# Patient Record
Sex: Female | Born: 1950
Health system: Southern US, Community
[De-identification: ages and names within clinical notes are randomized; demographics above are authoritative.]

## PROBLEM LIST (undated history)

## (undated) DIAGNOSIS — S82142A Displaced bicondylar fracture of left tibia, initial encounter for closed fracture: Secondary | ICD-10-CM

## (undated) DIAGNOSIS — G8929 Other chronic pain: Secondary | ICD-10-CM

## (undated) DIAGNOSIS — E079 Disorder of thyroid, unspecified: Secondary | ICD-10-CM

---

## 1998-06-03 HISTORY — PX: REDUCTION MAMMAPLASTY: SUR839

## 2015-04-05 NOTE — Other (Signed)
Pat assessment completed with patient-reveals past history of MRSA with treatment 2014.l spoke with Dr Brien FewMantica and pre op antibiotic changed to vancomycin per order.

## 2015-04-05 NOTE — H&P (Signed)
[facility]  HISTORY AND PHYSICAL    Name:  Janet Chavez, Janet Chavez  MR#:  [id number]  DOB:  07-08-1950  Account #:  [id number]  Date of Adm:  04/06/2015    DATE OF SURGERY: 04/06/2015    HISTORY OF PRESENT ILLNESS: This 64 year old lady had a fall  several days ago getting out of bed. She had a valgus injury of  her left knee and a tibial plateau fracture. Lateral tibial  plateau fracture was diagnosed on plain x-rays, but the CT scan  did show depression and a small posterior corner fracture. The  patient understands the picture of the fracture. I showed her the  CT scan pictures and the x-rays and have described kyphoplasty as  a minimally invasive treatment for this tibial plateau fracture.  She understands that the risks include, but are not limited to,  infection, DVT with pulmonary embolus and pain postop. She  understands that she has to continue on limited weightbearing  schedule in a walker which she has. The patient has a past  medical history for COPD for which she takes ProAir twice a day  and uses a nebulizer on a p.r.n. basis. She has not required it  for about a month. She takes Bentyl and Nexium for acid reflux.  She takes Xanax and Vistaril for panic attacks. She takes Imitrex  for migraines and Ambien for sleep and she has taken oral  morphine over a period of 2 years, anywhere from 1-3 tablets  daily. For the past 3 days she has taken Excedrin. It is  recommended to the patient that she go back on the morphine prior  to surgery and be weaned off it after surgery on a gradual basis.    REVIEW OF SYSTEMS  GENERAL: She denies fevers, chills, fatigue.  HEAD, EARS, EYES, NOSE, THROAT: Denies cold symptoms, sinus pain.  RESPIRATORY: Denies cough or shortness of breath.  CARDIOVASCULAR: Denies chest pain or palpitations.  GASTROINTESTINAL: The patient had symptoms of abdominal pain, but  she has had no abdominal pain, nausea or vomiting with the Bentyl  and Nexium.   HEMATOLOGY: The patient has had no bleeding problems or anemia.  MUSCULOSKELETAL: She complains of left knee pain.  PERIPHERAL VASCULAR: No history of blood clots.  DERMATOLOGY: No history of rashes or skin lesions.  NEUROLOGIC: The patient has had no prior gait abnormality before  this injury. She has no seizure disorder.    The patient has had prior surgery with hysterectomy,  oophorectomy, gastric bypass, rotator cuff surgery, appendectomy,  left knee arthroscopy, left ankle surgery and right wrist  fracture that was treated nonoperatively.    ALLERGIES: THE PATIENT HAS NO KNOWN ALLERGIES.    PHYSICAL EXAMINATION  GENERAL: She is a well-developed, well-nourished, alert and  oriented though nervous, 64 year old lady.  VITAL SIGNS: Blood pressure is 126/80, heart rate is 84 and  regular, respiratory rate 16. The patient is 5 feet 6 inches  tall, weighs 250 pounds for a BMI of 40.35.  HEAD, EARS, EYES, NOSE AND THROAT: Atraumatic.  NECK: Supple.  CHEST: Clear to auscultation bilaterally. There is no wheezing.  She has no rhonchi.  HEART: Sounds are regular rhythm.  ABDOMEN: Soft and nontender.  EXTREMITIES: She has good pulses in all four extremities. In the  left knee, there is some mild swelling. There is tenderness  laterally and medially and there is a varus valgus instability  with pain.    DIAGNOSIS: Closed lateral tibial plateau fracture.  PLAN: Kyphoplasty.          Olen Pel MD    AV:[id number]  D:  04/05/2015   07:48  T:  04/05/2015   08:21  Job #:  [id number]

## 2015-04-06 ENCOUNTER — Inpatient Hospital Stay: Payer: MEDICARE

## 2015-04-06 ENCOUNTER — Ambulatory Visit: Admit: 2015-04-06 | Payer: MEDICARE | Primary: Emergency Medicine

## 2015-04-06 MED ORDER — BUPIVACAINE (PF) 0.5 % (5 MG/ML) IJ SOLN
0.5 % (5 mg/mL) | INTRAMUSCULAR | Status: AC
Start: 2015-04-06 — End: ?

## 2015-04-06 MED ORDER — LACTATED RINGERS BOLUS IV
Freq: Once | INTRAVENOUS | Status: DC | PRN
Start: 2015-04-06 — End: 2015-04-06

## 2015-04-06 MED ORDER — DEXAMETHASONE SODIUM PHOSPHATE 4 MG/ML IJ SOLN
4 mg/mL | INTRAMUSCULAR | Status: DC | PRN
Start: 2015-04-06 — End: 2015-04-06
  Administered 2015-04-06: 13:00:00 via INTRAVENOUS

## 2015-04-06 MED ORDER — SODIUM CHLORIDE 0.9 % IV PIGGY BACK
1000 mg | Freq: Once | INTRAVENOUS | Status: AC
Start: 2015-04-06 — End: 2015-04-06
  Administered 2015-04-06 (×2): via INTRAVENOUS

## 2015-04-06 MED ORDER — DEXAMETHASONE SODIUM PHOSPHATE 4 MG/ML IJ SOLN
4 mg/mL | INTRAMUSCULAR | Status: AC
Start: 2015-04-06 — End: ?

## 2015-04-06 MED ORDER — ONDANSETRON (PF) 4 MG/2 ML INJECTION
4 mg/2 mL | INTRAMUSCULAR | Status: AC
Start: 2015-04-06 — End: ?

## 2015-04-06 MED ORDER — BUPIVACAINE (PF) 0.5 % (5 MG/ML) IJ SOLN
0.5 % (5 mg/mL) | INTRAMUSCULAR | Status: DC | PRN
Start: 2015-04-06 — End: 2015-04-06
  Administered 2015-04-06: 13:00:00

## 2015-04-06 MED ORDER — IOPAMIDOL 61 % IV SOLN
300 mg iodine /mL (61 %) | INTRAVENOUS | Status: AC
Start: 2015-04-06 — End: ?

## 2015-04-06 MED ORDER — LIDOCAINE-EPINEPHRINE 1 %-1:100,000 IJ SOLN
1 %-:00,000 | INTRAMUSCULAR | Status: AC
Start: 2015-04-06 — End: ?

## 2015-04-06 MED ORDER — BACITRACIN 50,000 UNIT IM
50000 unit | INTRAMUSCULAR | Status: DC | PRN
Start: 2015-04-06 — End: 2015-04-06
  Administered 2015-04-06: 13:00:00

## 2015-04-06 MED ORDER — ONDANSETRON (PF) 4 MG/2 ML INJECTION
4 mg/2 mL | INTRAMUSCULAR | Status: DC | PRN
Start: 2015-04-06 — End: 2015-04-06
  Administered 2015-04-06: 13:00:00 via INTRAVENOUS

## 2015-04-06 MED ORDER — SODIUM CHLORIDE 0.9 % IJ SYRG
Freq: Three times a day (TID) | INTRAMUSCULAR | Status: DC
Start: 2015-04-06 — End: 2015-04-06

## 2015-04-06 MED ORDER — FENTANYL CITRATE (PF) 50 MCG/ML IJ SOLN
50 mcg/mL | INTRAMUSCULAR | Status: AC
Start: 2015-04-06 — End: ?

## 2015-04-06 MED ORDER — ONDANSETRON (PF) 4 MG/2 ML INJECTION
4 mg/2 mL | Freq: Once | INTRAMUSCULAR | Status: DC | PRN
Start: 2015-04-06 — End: 2015-04-06

## 2015-04-06 MED ORDER — FENTANYL CITRATE (PF) 50 MCG/ML IJ SOLN
50 mcg/mL | INTRAMUSCULAR | Status: DC | PRN
Start: 2015-04-06 — End: 2015-04-06
  Administered 2015-04-06: 14:00:00 via INTRAVENOUS

## 2015-04-06 MED ORDER — LIDOCAINE-EPINEPHRINE 1 %-1:100,000 IJ SOLN
1 %-:00,000 | INTRAMUSCULAR | Status: DC | PRN
Start: 2015-04-06 — End: 2015-04-06
  Administered 2015-04-06: 13:00:00 via INTRADERMAL

## 2015-04-06 MED ORDER — PROPOFOL 10 MG/ML IV EMUL
10 mg/mL | INTRAVENOUS | Status: DC | PRN
Start: 2015-04-06 — End: 2015-04-06
  Administered 2015-04-06: 12:00:00 via INTRAVENOUS

## 2015-04-06 MED ORDER — FENTANYL CITRATE (PF) 50 MCG/ML IJ SOLN
50 mcg/mL | INTRAMUSCULAR | Status: AC
Start: 2015-04-06 — End: 2015-04-06
  Administered 2015-04-06: 13:00:00 via INTRAVENOUS

## 2015-04-06 MED ORDER — SODIUM CHLORIDE 0.9 % IJ SYRG
INTRAMUSCULAR | Status: DC | PRN
Start: 2015-04-06 — End: 2015-04-06

## 2015-04-06 MED ORDER — BACITRACIN 50,000 UNIT IM
50000 unit | INTRAMUSCULAR | Status: AC
Start: 2015-04-06 — End: ?

## 2015-04-06 MED ORDER — DIPHENHYDRAMINE HCL 50 MG/ML IJ SOLN
50 mg/mL | Freq: Once | INTRAMUSCULAR | Status: DC | PRN
Start: 2015-04-06 — End: 2015-04-06

## 2015-04-06 MED ORDER — KETOROLAC TROMETHAMINE 30 MG/ML INJECTION
30 mg/mL (1 mL) | INTRAMUSCULAR | Status: AC
Start: 2015-04-06 — End: ?

## 2015-04-06 MED ORDER — LACTATED RINGERS IV
INTRAVENOUS | Status: DC
Start: 2015-04-06 — End: 2015-04-06
  Administered 2015-04-06: 12:00:00 via INTRAVENOUS

## 2015-04-06 MED ORDER — HYDROMORPHONE (PF) 2 MG/ML IJ SOLN
2 mg/mL | INTRAMUSCULAR | Status: AC | PRN
Start: 2015-04-06 — End: 2015-04-06
  Administered 2015-04-06 (×4): via INTRAVENOUS

## 2015-04-06 MED ORDER — FENTANYL CITRATE (PF) 50 MCG/ML IJ SOLN
50 mcg/mL | INTRAMUSCULAR | Status: DC | PRN
Start: 2015-04-06 — End: 2015-04-06
  Administered 2015-04-06: 12:00:00 via INTRAVENOUS

## 2015-04-06 MED ORDER — KETOROLAC TROMETHAMINE 30 MG/ML INJECTION
30 mg/mL (1 mL) | INTRAMUSCULAR | Status: DC | PRN
Start: 2015-04-06 — End: 2015-04-06
  Administered 2015-04-06: 13:00:00 via INTRAVENOUS

## 2015-04-06 MED ORDER — EPHEDRINE SULFATE 50 MG/ML IJ SOLN
50 mg/mL | INTRAMUSCULAR | Status: AC
Start: 2015-04-06 — End: ?

## 2015-04-06 MED ORDER — EPHEDRINE SULFATE 50 MG/ML IJ SOLN
50 mg/mL | INTRAMUSCULAR | Status: DC | PRN
Start: 2015-04-06 — End: 2015-04-06
  Administered 2015-04-06 (×2): via INTRAVENOUS

## 2015-04-06 MED FILL — DEXAMETHASONE SODIUM PHOSPHATE 4 MG/ML IJ SOLN: 4 mg/mL | INTRAMUSCULAR | Qty: 1

## 2015-04-06 MED FILL — VANCOMYCIN 1,000 MG IV SOLR: 1000 mg | INTRAVENOUS | Qty: 1000

## 2015-04-06 MED FILL — XYLOCAINE WITH EPINEPHRINE 1 %-1:100,000 INJECTION SOLUTION: 1 %-:00,000 | INTRAMUSCULAR | Qty: 20

## 2015-04-06 MED FILL — DEXAMETHASONE SODIUM PHOSPHATE 4 MG/ML IJ SOLN: 4 mg/mL | INTRAMUSCULAR | Qty: 4

## 2015-04-06 MED FILL — BACITRACIN 50,000 UNIT IM: 50000 unit | INTRAMUSCULAR | Qty: 50000

## 2015-04-06 MED FILL — KETOROLAC TROMETHAMINE 30 MG/ML INJECTION: 30 mg/mL (1 mL) | INTRAMUSCULAR | Qty: 1

## 2015-04-06 MED FILL — EPHEDRINE SULFATE 50 MG/ML IJ SOLN: 50 mg/mL | INTRAMUSCULAR | Qty: 1

## 2015-04-06 MED FILL — DIPRIVAN 10 MG/ML INTRAVENOUS EMULSION: 10 mg/mL | INTRAVENOUS | Qty: 200

## 2015-04-06 MED FILL — LACTATED RINGERS IV: INTRAVENOUS | Qty: 1000

## 2015-04-06 MED FILL — BD POSIFLUSH NORMAL SALINE 0.9 % INJECTION SYRINGE: INTRAMUSCULAR | Qty: 10

## 2015-04-06 MED FILL — LACTATED RINGERS IV: INTRAVENOUS | Qty: 750

## 2015-04-06 MED FILL — KETOROLAC TROMETHAMINE 30 MG/ML INJECTION: 30 mg/mL (1 mL) | INTRAMUSCULAR | Qty: 30

## 2015-04-06 MED FILL — EPHEDRINE SULFATE 50 MG/ML IJ SOLN: 50 mg/mL | INTRAMUSCULAR | Qty: 30

## 2015-04-06 MED FILL — ONDANSETRON (PF) 4 MG/2 ML INJECTION: 4 mg/2 mL | INTRAMUSCULAR | Qty: 2

## 2015-04-06 MED FILL — ONDANSETRON (PF) 4 MG/2 ML INJECTION: 4 mg/2 mL | INTRAMUSCULAR | Qty: 4

## 2015-04-06 MED FILL — HYDROMORPHONE (PF) 2 MG/ML IJ SOLN: 2 mg/mL | INTRAMUSCULAR | Qty: 1

## 2015-04-06 MED FILL — FENTANYL CITRATE (PF) 50 MCG/ML IJ SOLN: 50 mcg/mL | INTRAMUSCULAR | Qty: 2

## 2015-04-06 MED FILL — VANCOMYCIN 1,000 MG IV SOLR: 1000 mg | INTRAVENOUS | Qty: 1

## 2015-04-06 MED FILL — ISOVUE-300  61 % INTRAVENOUS SOLUTION: 300 mg iodine /mL (61 %) | INTRAVENOUS | Qty: 100

## 2015-04-06 MED FILL — BUPIVACAINE (PF) 0.5 % (5 MG/ML) IJ SOLN: 0.5 % (5 mg/mL) | INTRAMUSCULAR | Qty: 30

## 2015-04-06 NOTE — Op Note (Signed)
OPERATIVE NOTE    Date of Procedure: 04/06/2015   Preoperative Diagnosis: LEFT TIBIAL PLATEAU FRACTURE  Postoperative Diagnosis: LEFT TIBIAL PLATEAU FRACTURE    Procedure(s):  KYPHOPLASTY LEFT PROXIMAL TIBIA WITH FLUOROSCOPY  Surgeon(s) and Role:     * Zaiden Ludlum P. Malaysha Arlen, MD - Primary  Anesthesia: General   Estimated Blood Loss: none  Specimens: * No specimens in log *   Findings: 64 yo lady is 11/2 weeks after a fall OOB with a lateral tibial plateau compression fracture on xray and CT scan.   Unknown JimGriselle came to the OR and was ID'd by Careers advisersurgeon and anesthesia.  GA administered.  Fluoro used to locate incision.  Lidocaine/marcaine mixture injected in the op site and a 11/2 cm incision made and the lipped cannula and trochar introduced under fluoro.  Balloon was inserted through the cannula and inflated slowly with opaque dye to expand just below the fracture, AP and lateral viewing used.  Balloon was removed and the cavity was filled with 3 cc of ca phosphate.  xrays were taken to show position of the filled cavity supporting and elevating the fracture.  Antibiotic irrigation used and the wound was closed with a subcuticular vicryl suture.  Sterile bandage placed and knee immobilizer.  She went to the RR in good condition.  She was moving her toes well.  Complications: none  Implants:   Implant Name Type Inv. Item Serial No. Manufacturer Lot No. LRB No. Used Action   BONE VOID FILLER    [employer] [id number] Left 1 Implanted   BONE VOID FILLER       [employer]KELET[employer] [id number] Left 1 Implanted       Liyla Radliff P. Lafonda Patron, MD  04/06/2015  9:41 AM  OPERATIVE NOTE    Date of Procedure: 04/06/2015   Preoperative Diagnosis: LEFT TIBIAL PLATEAU FRACTURE  Postoperative Diagnosis: LEFT TIBIAL PLATEAU FRACTURE    Procedure(s):  KYPHOPLASTY LEFT PROXIMAL TIBIA WITH FLUOROSCOPY  Surgeon(s) and Role:     * Adaia Matthies P. Jorie Zee, MD - Primary  Anesthesia: General   Estimated Blood Loss: none  Specimens: * No specimens in log *   Findings:  as above   Complications: none  Implants:   Implant Name Type Inv. Item Serial No. Manufacturer Lot No. LRB No. Used Action   BONE VOID FILLER    SKELET[employer] [id number] Left 1 Implanted   BONE VOID FILLER       SKELET[employer] [id number] Left 1 Implanted       Shekera Beavers P. Dawsen Krieger, MD  04/06/2015  9:42 AM

## 2015-04-06 NOTE — Other (Signed)
Janet Chavez in room to see & speak to pt; clarified that pt has no steps or stairs at home.

## 2015-04-06 NOTE — Op Note (Signed)
OPERATIVE NOTE    Date of Procedure: 04/06/2015   Preoperative Diagnosis: LEFT TIBIAL PLATEAU FRACTURE  Postoperative Diagnosis: LEFT TIBIAL PLATEAU FRACTURE    Procedure(s):  KYPHOPLASTY LEFT PROXIMAL TIBIA WITH FLUOROSCOPY  Surgeon(s) and Role:     * Marl Seago P. Lawrence Roldan, MD - Primary  Anesthesia: General   Estimated Blood Loss: none  Specimens: * No specimens in log *   Findings: 64 yo lady is 11/2 weeks after a fall OOB with a lateral tibial plateau compression fracture on xray and CT scan.   Unknown JimGriselle came to the OR and was ID'd by Careers advisersurgeon and anesthesia.  GA administered.  Fluoro used to locate incision.  Lidocaine/marcaine mixture injected in the op site and a 11/2 cm incision made and the lipped cannula and trochar introduced under fluoro.  Balloon was inserted through the cannula and inflated slowly with opaque dye to expand just below the fracture, AP and lateral viewing used.  Balloon was removed and the cavity was filled with 3 cc of ca phosphate.  xrays were taken to show position of the filled cavity supporting and elevating the fracture.  Antibiotic irrigation used and the wound was closed with a subcuticular vicryl suture.  Sterile bandage placed and knee immobilizer.  She went to the RR in good condition.  She was moving her toes well.  Complications: none  Implants:   Implant Name Type Inv. Item Serial No. Manufacturer Lot No. LRB No. Used Action   BONE VOID FILLER    S[employer]LET[employer] [id number] Left 1 Implanted   BONE VOID FILLER       SKELET[employer] [id number] Left 1 Implanted       Jahmeir Geisen P. Ameah Chanda, MD  04/06/2015  9:41 AM  OPERATIVE NOTE    Date of Procedure: 04/06/2015   Preoperative Diagnosis: LEFT TIBIAL PLATEAU FRACTURE  Postoperative Diagnosis: LEFT TIBIAL PLATEAU FRACTURE    Procedure(s):  KYPHOPLASTY LEFT PROXIMAL TIBIA WITH FLUOROSCOPY  Surgeon(s) and Role:     * Genni Buske P. Seleta Hovland, MD - Primary  Anesthesia: General   Estimated Blood Loss: none  Specimens: * No specimens in log *    Findings: as above   Complications: none  Implants:   Implant Name Type Inv. Item Serial No. Manufacturer Lot No. LRB No. Used Action   BONE VOID FILLER    SKELET[employer] [id number] Left 1 Implanted   BONE VOID FILLER       SKELET[employer] [id number] Left 1 Implanted       Thy Gullikson P. Isair Inabinet, MD  04/06/2015  9:42 AM

## 2015-04-06 NOTE — Other (Signed)
Declines attempt to void in bathroom stating she does not have to go and does not need to try.  Reviewed that if at home she experiences bladder fullness and/or discomfort and inability to urinate she would need to seek medical attention in Emergency Dept; verbalized understanding.  States she is ready to go home now.

## 2015-04-06 NOTE — Other (Signed)
Spoke to Dr. Olen Pelobert Mantica via phone at pt's request for clarification whether she is to have antibiotic to take at home.  Pt stated Dr. Brien FewMantica told her pre-op in his office that she would have antibiotic post-op at home.  At this time Dr. Brien FewMantica stated that no post-op antibiotics are needed at home: "tell her she had Vancomycin during surgery, the wound was irrigated with antibiotic during surgery, and the procedure was short".  Pt informed and verbalized understanding.

## 2015-04-06 NOTE — Other (Signed)
Received from RR via stretcher with Bennie Dallasraci Drasher, RN.  Color pink, skin warm and dry.  Respirations even and unlabored.  Aware procedure completed and back in SDS.  Able to answer questions verbally.  No IV fluids.  Ace wrap DRSG to and immobilizer to left knee dry & intact.  Left leg elevated on pillows.  Ice pack to left knee.  Reports pain level has decreased with pain medication received in PACU, now "mild 3"/10. Toes left foot pale, slightly cool (equally so with toes right foot), with capillary refill less than three seconds, good sensation and movement.  Left dorsalis pedis pulse palpated.  Denies nausea.  Intermittent compression sleeves in use to bilat LE calves.  Call bell in reach & instructed in use.

## 2015-04-06 NOTE — Anesthesia Post-Procedure Evaluation (Signed)
Post-Anesthesia Evaluation and Assessment    Patient: Janet Chavez MRN: [id number]  SSN: [id number]    Date of Birth: 07/25/1950  Age: 64 y.o.  Sex: female       Cardiovascular Function/Vital Signs  Visit Vitals   ??? BP 147/79   ??? Pulse 87   ??? Temp 36.7 ??C (98 ??F)   ??? Resp 20   ??? Ht 5\' 6"  (1.676 m)   ??? Wt 113.4 kg (250 lb)   ??? SpO2 100%   ??? BMI 40.35 kg/m2       Patient is status post general anesthesia for Procedure(s):  KYPHOPLASTY LEFT PROXIMAL TIBIA WITH FLUOROSCOPY.    Nausea/Vomiting: None    Postoperative hydration reviewed and adequate.    Pain:  Pain Scale 1: Numeric (0 - 10) (04/06/15 0708)  Pain Intensity 1: 7 (04/06/15 0708)   Managed    Neurological Status:       At baseline    Mental Status and Level of Consciousness: Arousable    Pulmonary Status:   O2 Device: Oxygen mask (04/06/15 0922)   Adequate oxygenation and airway patent    Complications related to anesthesia: None    Post-anesthesia assessment completed. No concerns    Signed By: Melbourne AbtsJames D Josanna Hefel, MD     April 06, 2015

## 2015-04-06 NOTE — Progress Notes (Signed)
Problem: Mobility Impaired (Adult and Pediatric)  Goal: *Acute Goals and Plan of Care (Insert Text)  Independent ambulation with rollator walker - 1 visit  PHYSICAL THERAPY EVALUATION  (AMBULATORY SURGERY, EMERGENCY ROOM & RECOVERY ROOM PATIENTS)     Patient: Janet Chavez (64 y.o. female)  Date: 04/06/2015     Primary Diagnosis and Medical History: LEFT TIBIAL PLATEAU FRACTURE  Procedure(s) (LRB):  KYPHOPLASTY LEFT PROXIMAL TIBIA WITH FLUOROSCOPY (Left) Day of Surgery                 Past Medical History   Diagnosis Date   ??? Cancer (HCC)         bone cancer?   ??? Chronic narcotic use     ??? Chronic obstructive pulmonary disease (HCC)     ??? Chronic pain     ??? Closed fracture of lateral portion of left tibial plateau 04/06/2015   ??? Constipation     ??? Falls frequently     ??? Fracture, tibial plateau 2016       left   ??? Hx MRSA infection 2014       left foot   ??? Hyperlipemia     ??? Migraine     ??? Morbid obesity (HCC)     ??? Panic attacks     ??? Psychiatric disorder         depression   ??? Stress incontinence     ??? Thyroid disease         hypothroidism     Past Surgical History   Procedure Laterality Date   ??? Hx hysterectomy       ??? Hx cholecystectomy       ??? Hx appendectomy       ??? Hx orthopaedic Left         orif ankle hardware-removed    ??? Hx knee replacement Right     ??? Hx wrist fracture tx Right 2015   ??? Hx knee arthroscopy Left     ??? Hx hysterectomy       ??? Hx gastric bypass   2006       bypass   ??? Hx other surgical Left 2014       debridement of foot wound -mrsa     Patient Active Problem List   Diagnosis Code   (none) - all problems resolved or deleted        Prior Level of Function/Home Situation: independent ambulation with rollator              Home Situation  Current DME Used/Available at Home: Dan Humphreys, rollator, Wheelchair     Ordered [employer] Bearing Status:  left non-weight     Equipment: walker and wheelchair     Critical Behavior:              Neurologic State: Alert              Orientation Level: Oriented X4               Cognition: Appropriate decision making, Appropriate for age attention/concentration, Appropriate safety awareness              Safety/Judgement: Awareness of environment         OBJECTIVE DATA SUMMARY:       Past Medical History   Diagnosis Date   ??? Cancer (HCC)         bone cancer?   ??? Chronic narcotic use     ??? Chronic obstructive pulmonary  disease (HCC)     ??? Chronic pain     ??? Closed fracture of lateral portion of left tibial plateau 04/06/2015   ??? Constipation     ??? Falls frequently     ??? Fracture, tibial plateau 2016       left   ??? Hx MRSA infection 2014       left foot   ??? Hyperlipemia     ??? Migraine     ??? Morbid obesity (HCC)     ??? Panic attacks     ??? Psychiatric disorder         depression   ??? Stress incontinence     ??? Thyroid disease         hypothroidism     Past Surgical History   Procedure Laterality Date   ??? Hx hysterectomy       ??? Hx cholecystectomy       ??? Hx appendectomy       ??? Hx orthopaedic Left         orif ankle hardware-removed    ??? Hx knee replacement Right     ??? Hx wrist fracture tx Right 2015   ??? Hx knee arthroscopy Left     ??? Hx hysterectomy       ??? Hx gastric bypass   2006       bypass   ??? Hx other surgical Left 2014       debridement of foot wound -mrsa        Strength:                              Gross Upper Extremity Strength:   [ ]         Generally decreased; but functional  [ ]         Grossly decreased; not functional  [ ]         Limitation:              [X]         Within functional limits                        Gross Lower Extremity Strength:   [X]         Generally decreased; but functional  [ ]         Grossly decreased; not functional  [ ]         Limitation:              [ ]         Within functional limits              Range Of Motion:                            Gross Upper Extremity Range of Motion:   [ ]         Generally decreased; but functional  [ ]         Grossly decreased; not functional  [ ]         Limitation:               [X]         Within functional limits                        Gross Lower Extremity Range of Motion:   [ ]   Generally decreased; but functional          Grossly decreased; not functional          Limitation:                      Within functional limits             Ambulation:              Assistive Device:           Cane          Crutches          Walker                      Walker with Wheels                    Weight Bearing Limitations:                    Right               Left          Full Weight Bearing          Weight Bearing To Tolerance          Partial Weight Bearing:                       Toe Touch Weight Bearing               Non Weight Bearing                      Other    Pain:              Pain Scale 1: Numeric (0 - 10)              Pain Intensity 1: 0              Pain Location 1: Knee     Education:             Role of P.T. explained to the patient          Patient was instructed in safe use of an assistive device with weight bearing restrictions as listed by physician                      Patient was instructed in safe transfer technique          Patient educated in R.I.C.E. -Rest. Ice. Compression. Elevation for next several days                      Patient was instructed in Stair Management                                Verbal Instructions and Demonstration                                Verbal Instructions                Patient voiced understanding of all education provided  Patient is discharged from physical therapy at this time.     Camillia Herter, PT   Time Calculation: 20 mins     Current:  G code: 8978  Severity Modifier:CI  Determined by: Skilled PT Assessment/Observation  Goal:      G code: 8979  Severity Modifier: CI  Determined by: Skilled PT Assessment/Observation   D/C:        G code: 8980  Severity Modifier: CI  Determined by: Skilled PT Assessment/Observation

## 2015-04-06 NOTE — Anesthesia Pre-Procedure Evaluation (Signed)
Anesthetic History   No history of anesthetic complications            Review of Systems / Medical History  Patient summary reviewed, nursing notes reviewed and pertinent labs reviewed    Pulmonary    COPD: moderate               Neuro/Psych         Headaches and psychiatric history     Cardiovascular              Hyperlipidemia         GI/Hepatic/Renal  Within defined limits              Endo/Other      Hypothyroidism: well controlled  Morbid obesity     Other Findings              Physical Exam    Airway  Mallampati: II  TM Distance: > 6 cm  Neck ROM: normal range of motion   Mouth opening: Normal     Cardiovascular  Regular rate and rhythm,  S1 and S2 normal,  no murmur, click, rub, or gallop             Dental  No notable dental hx       Pulmonary  Breath sounds clear to auscultation               Abdominal  GI exam deferred       Other Findings            Anesthetic Plan    ASA: 3  Anesthesia type: general          Induction: Intravenous  Anesthetic plan and risks discussed with: Patient

## 2016-04-02 NOTE — Other (Signed)
Spoke with Dr Brien FewMantica regarding past history of MRSA-antibiotic changed to vancomycin. He would like patient to come to hospital for mrsa nasal swab prior to surgery. Message left for patient to return call.

## 2016-04-02 NOTE — Other (Signed)
Personal care aide for Unknown JimGriselle returned phone call reports patient  Is getting sick having flare up of asthma and needs to reschedule procedure.Spoke to CameronDoreen with message will call patient.

## 2018-04-08 ENCOUNTER — Emergency Department: Admit: 2018-04-08 | Payer: MEDICARE | Primary: Emergency Medicine

## 2018-04-08 ENCOUNTER — Inpatient Hospital Stay
Admit: 2018-04-08 | Discharge: 2018-04-09 | Disposition: A | Discharge status: Home / Self Care | Payer: MEDICARE | Attending: Emergency Medicine

## 2018-04-08 DIAGNOSIS — J441 Chronic obstructive pulmonary disease with (acute) exacerbation: Secondary | ICD-10-CM

## 2018-04-08 MED ORDER — IPRATROPIUM-ALBUTEROL 2.5 MG-0.5 MG/3 ML NEB SOLUTION
2.5 mg-0.5 mg/3 ml | RESPIRATORY_TRACT | Status: AC
Start: 2018-04-08 — End: 2018-04-08
  Administered 2018-04-09: via RESPIRATORY_TRACT

## 2018-04-08 MED ORDER — METHYLPREDNISOLONE (PF) 125 MG/2 ML IJ SOLR
125 mg/2 mL | INTRAMUSCULAR | Status: DC
Start: 2018-04-08 — End: 2018-04-08
  Administered 2018-04-08: via INTRAVENOUS

## 2018-04-08 NOTE — ED Provider Notes (Signed)
CC:  SOB and cough    HPI:     Patient is a 67 y.o. female who presents with nonprod cough and increased SOB over the past 3d.  Pt notes andterior chest pain with cough    Sx present for   3  days        Pt denies: HA, sore throat, N/V or abd pain or increaed leg edema or pain          PCP:  Lucas Mallow, MD    PMHx:    Past Medical History:   Diagnosis Date   ??? Cancer ([place])     bone cancer?   ??? Chronic narcotic use    ??? Chronic obstructive pulmonary disease ([place])    ??? Chronic pain    ??? Closed fracture of lateral portion of left tibial plateau 04/06/2015   ??? Constipation    ??? Falls frequently    ??? Fracture, tibial plateau 2016    left   ??? Hx MRSA infection 2014    left foot   ??? Hyperlipemia    ??? Migraine    ??? Morbid obesity ([place])    ??? Panic attacks    ??? Psychiatric disorder     depression   ??? Stress incontinence    ??? Thyroid disease     hypothroidism       PSHx:  Past Surgical History:   Procedure Laterality Date   ??? HX APPENDECTOMY     ??? HX CHOLECYSTECTOMY     ??? HX GASTRIC BYPASS  2006    bypass   ??? HX HYSTERECTOMY     ??? HX KNEE ARTHROSCOPY Left    ??? HX KNEE REPLACEMENT Right    ??? HX ORTHOPAEDIC Left     orif ankle hardware-removed x 4   ??? HX OTHER SURGICAL Left 2014    debridement of foot wound -mrsa positive   ??? HX WRIST FRACTURE TX Right 2015   ??? PLASTY TIBIAL PLATEAU+DEBRIDE Left 2016       Soc Hx /  Fam Hx:  Social History     Socioeconomic History   ??? Marital status: DIVORCED     Spouse name: Not on file   ??? Number of children: Not on file   ??? Years of education: Not on file   ??? Highest education level: Not on file   Tobacco Use   ??? Smoking status: Never Smoker   ??? Smokeless tobacco: Never Used   Substance and Sexual Activity   ??? Alcohol use: No   ??? Drug use: No     History reviewed. No pertinent family history.    ROS:  Const:  no fever,  no fatigue  Eyes;  no pain, no vision changes  Orophar:  no sore throat, no ear pain  Resp:  +cough,  + SOB  Card:  no chest pain,  no palpitations  Abd:  no vomiting,  no  diarrhea  GU:  no dysuria, no hematuria  Musc:  no back pain, no joint pain  Neuro:  no headache,  no numbness  Skin:  no rash,  no petechia      PE:  Vs:   per RN notes  Visit Vitals  BP 150/64 (BP 1 Location: Left arm, BP Patient Position: At rest)   Pulse 94   Temp 98 ??F (36.7 ??C)   Resp 18   Ht '5\' 6"'  (1.676 m)   Wt 121.6 kg (268 lb)   SpO2 95%  BMI 43.26 kg/m??     Head:  NCAT  Const:  WDWN, no distress  Eyes:  conj pink,  anicteric  Orophar:  no edema,  no erythema  Neck:  trachea midline, supple  Resp:  nl excursions,  normal BS,  no rales, diffuse exp wheezes  Card:  nl rate,  regular rhythm,  no murmur,   Good cap refill in all extrem (< 3 sec)  Abd:  non-distended,  soft,  tenderness = none    Back:  supple,  not tender  GU:  Musc:  +1 b leg edema, no cyanosis    Neuro:  awake and alert   CN:  no facial asymmetry   Motor:  symmetric strength in upper and lower extrem       Skin:  warm, moist, no rash  Psych:  calm, normal affect      Lab results:  Recent Results (from the past 24 hour(s))   CBC WITH AUTOMATED DIFF    Collection Time: 04/08/18  6:52 PM   Result Value Ref Range    WBC 8.2 4.8 - 10.6 K/uL    RBC 5.01 4.20 - 5.40 M/uL    HGB 14.2 12.0 - 16.0 g/dL    HCT 44.9 36.0 - 47.0 %    MCV 89.6 81.0 - 94.0 FL    MCH 28.3 27.0 - 35.0 PG    MCHC 31.6 30.7 - 37.3 g/dL    RDW 13.6 11.5 - 14.0 %    PLATELET 410 (H) 130 - 400 K/uL    MPV 9.8 9.2 - 11.8 FL    NRBC 0.0 0 PER 100 WBC    ABSOLUTE NRBC 0.00 0.0 - 0.01 K/uL    NEUTROPHILS 42 (L) 48.0 - 72.0 %    LYMPHOCYTES 44 (H) 18.0 - 40.0 %    MONOCYTES 8 2.0 - 12.0 %    EOSINOPHILS 4 0.0 - 7.0 %    BASOPHILS 1 0.0 - 3.0 %    IMMATURE GRANULOCYTES 0 0.0 - 0.5 %    ABS. NEUTROPHILS 3.5 2.3 - 7.6 K/UL    ABS. LYMPHOCYTES 3.6 0.9 - 4.2 K/UL    ABS. MONOCYTES 0.7 0.1 - 1.7 K/UL    ABS. EOSINOPHILS 0.4 0.0 - 1.0 K/UL    ABS. BASOPHILS 0.1 0.0 - 0.4 K/UL    ABS. IMM. GRANS. 0.0 0.0 - 0.17 K/UL    DF AUTOMATED     METABOLIC PANEL, COMPREHENSIVE    Collection Time:  04/08/18  6:52 PM   Result Value Ref Range    Sodium 143 136 - 145 mmol/L    Potassium 3.2 (L) 3.5 - 5.1 mmol/L    Chloride 105 98 - 107 mmol/L    CO2 27 21 - 32 mmol/L    Anion gap 11 4 - 12 mmol/L    Glucose 110 (H) 74 - 106 mg/dL    BUN 7 7 - 18 mg/dL    Creatinine 0.83 0.55 - 1.02 mg/dL    GFR est AA >60 >60 ml/min/1.33m    GFR est non-AA >60 >60 ml/min/1.775m   Calcium 9.2 8.5 - 10.1 mg/dL    Bilirubin, total 0.3 0.2 - 1.0 mg/dL    ALT (SGPT) 14 12 - 78 U/L    AST (SGOT) 15 15 - 37 U/L    Alk. phosphatase 93 46 - 116 U/L    Protein, total 7.2 6.4 - 8.2 g/dL    Albumin 3.3 (L) 3.4 - 5.0  g/dL    Globulin 3.9 2.8 - 3.9 g/dL    A-G Ratio 0.8 (L) 1.0 - 1.5     EKG, 12 LEAD, INITIAL    Collection Time: 04/08/18  7:28 PM   Result Value Ref Range    Ventricular Rate 81 BPM    Atrial Rate 81 BPM    P-R Interval 136 ms    QRS Duration 70 ms    Q-T Interval 372 ms    QTC Calculation (Bezet) 432 ms    Calculated P Axis 29 degrees    Calculated R Axis -5 degrees    Calculated T Axis 45 degrees    Diagnosis       Normal sinus rhythm  Nonspecific ST abnormality  Abnormal ECG  No previous ECGs available             Rad results:  XR CHEST PA LAT   Final Result   IMPRESSION: No acute pulmonary disease. If the patient has persistent symptoms   then a follow-up chest x-ray or CT scan would be advised.                  ED Course: Case endorse to Dr Roche pending  CXR, Labs and reeval    Visit Vitals  BP 150/64 (BP 1 Location: Left arm, BP Patient Position: At rest)   Pulse 94   Temp 98 ??F (36.7 ??C)   Resp 18   Ht '5\' 6"'  (1.676 m)   Wt 121.6 kg (268 lb)   SpO2 95%   BMI 43.26 kg/m??          Medications   predniSONE (DELTASONE) tablet 20 mg (has no administration in time range)   levalbuterol (XOPENEX) nebulizer soln 1.25 mg/3 mL (has no administration in time range)   albuterol-ipratropium (DUO-NEB) 2.5 MG-0.5 MG/3 ML (3 mL Nebulization Given 04/08/18 1925)   predniSONE (DELTASONE) tablet 60 mg (60 mg Oral Given 04/08/18 1929)            Critical care time (if provided) excluding procedural time was  0  minutes.    FINAL DX:  Diagnoses that have been ruled out:   None   Diagnoses that are still under consideration:   None   Final diagnoses:   COPD exacerbation ([place])   Acute bronchitis, unspecified organism       DISPO:   pending  Rx:   I have reviewed the following home medications:  Prior to Admission medications    Medication Sig Start Date End Date Taking? Authorizing Provider   fluticasone furoate-vilanterol (BREO ELLIPTA) 200-25 mcg/dose inhaler Take 1 Puff by inhalation daily.   Yes Other, Phys, MD   albuterol (PROAIR HFA) 90 mcg/actuation inhaler Take 2 Puffs by inhalation two (2) times a day.   Yes Provider, Historical   albuterol (PROVENTIL VENTOLIN) 2.5 mg /3 mL (0.083 %) nebulizer solution 2.5 mg by Nebulization route once.   Yes Provider, Historical   docusate sodium (COLACE) 100 mg capsule Take 100 mg by mouth daily.   Yes Provider, Historical   aspirin 81 mg chewable tablet Take 81 mg by mouth daily.   Yes Provider, Historical   esomeprazole (NEXIUM) 40 mg capsule Take 40 mg by mouth daily.   Yes Provider, Historical   ALPRAZolam (XANAX) 1 mg tablet Take 1 mg by mouth three (3) times daily as needed for Anxiety.   Yes Provider, Historical   pregabalin (LYRICA) 50 mg capsule Take 50 mg by mouth two (2)  times a day.   Yes Provider, Historical   lactulose (CHRONULAC) 10 gram/15 mL solution Take  by mouth daily.   Yes Provider, Historical   levothyroxine (SYNTHROID) 137 mcg tablet Take 137 mcg by mouth Daily (before breakfast).   Yes Provider, Historical   zolpidem CR (AMBIEN CR) 12.5 mg tablet Take 12.5 mg by mouth nightly as needed for Sleep.   Yes Provider, Historical   onabotulinumtoxinA (BOTOX) 100 unit injection once. Indications: MIGRAINE PREVENTION    Provider, Historical   montelukast (SINGULAIR) 10 mg tablet Take 10 mg by mouth daily.    Provider, Historical   dicyclomine (BENTYL) 20 mg tablet Take 20 mg by mouth as  needed.    Provider, Historical   simvastatin (ZOCOR) 10 mg tablet Take 10 mg by mouth nightly.    Provider, Historical   escitalopram oxalate (LEXAPRO) 20 mg tablet Take 20 mg by mouth daily.    Provider, Historical   diclofenac (VOLTAREN) 1 % gel Apply  to affected area three (3) times daily.    Provider, Historical   SUMAtriptan (IMITREX) 100 mg tablet Take 100 mg by mouth once as needed for Migraine.    Provider, Historical   morphine CR (MS CONTIN) 15 mg CR tablet Take 15 mg by mouth three (3) times daily as needed.    Provider, Historical   hydrOXYzine (ATARAX) 25 mg tablet Take 25 mg by mouth three (3) times daily as needed for Itching.    Provider, Historical       Ascencion Dike, MD

## 2018-04-08 NOTE — ED Notes (Signed)
7:33 PM accepted sign out on this 68 yo f h/o COPD presented with non productive cough, chest tightness and sob for 2-3 days to follow w/o, reevaluate and disposition, pt has been Rxed with a Duo neb, and Prednisone 60 mg as IV access was difficult.  Pt seen and examined, chest with bilateral expiratory wheezes and fair AE after initial nebulizer.  Xr Chest Pa Lat    Result Date: 04/08/2018  PROCEDURE:XR CHEST PA LAT HISTORY: Cough and shortness of breath PRIOR EXAMS: None FINDINGS: The heart is normal in size. The mediastinum and pulmonary vasculature is unremarkable. There is no acute infiltrate. Osseous structures are intact.     IMPRESSION: No acute pulmonary disease. If the patient has persistent symptoms then a follow-up chest x-ray or CT scan would be advised.   Labs unremarkable with nl WBC, no shift  Additional nebulizer with Xopenex ordered  7:57 PM pt wants to go home to continue additional nebulizers, will Rx tapering Prednisone, to f/u with Pulmonary in 2 days    IMP:URI, COPD Exacerbation  Plan:Steroids, nebulizers, antibx  Cond:Stable

## 2018-04-08 NOTE — ED Notes (Signed)
Physical assessment completed. The patient's level of consciousness is alert. The patient's mood is calm and cooperative. The patient's appearance shows normal skin assessment. The patient does not  indicate signs or symptoms of abuse or neglect.

## 2018-04-08 NOTE — ED Notes (Signed)
1930  Verbal bedside report received from of-going shift colleen s rn   Pt was seen in ed for copd exacerbation   Was medicated as ordered  Labs and tests were negative for acute abnormalities   2031  Pt was re-eval by ed provider and discharged home in stable condition.  the patient verbalized understanding on discharge,f/u , rx. . Opportunity was provided to ask questions .  Left ed with family member

## 2018-04-08 NOTE — ED Notes (Signed)
States  She  Has  Copd  And  Is  short  Of   Breath  Started  2  Days  Ago

## 2018-04-08 NOTE — ED Triage Notes (Signed)
States  She  Has  Copd  And  Is  short  Of   Breath  Started  2  Days  Ago

## 2018-04-08 NOTE — ED Notes (Signed)
7:33 PM accepted sign out on this 67 yo f h/o COPD presented with non productive cough, chest tightness and sob for 2-3 days to follow w/o, reevaluate and disposition, pt has been Rxed with a Duo neb, and Prednisone 60 mg as IV access was difficult.  Pt seen and examined, chest with bilateral expiratory wheezes and fair AE after initial nebulizer.  Xr Chest Pa Lat    Result Date: 04/08/2018  PROCEDURE:XR CHEST PA LAT HISTORY: Cough and shortness of breath PRIOR EXAMS: None FINDINGS: The heart is normal in size. The mediastinum and pulmonary vasculature is unremarkable. There is no acute infiltrate. Osseous structures are intact.     IMPRESSION: No acute pulmonary disease. If the patient has persistent symptoms then a follow-up chest x-ray or CT scan would be advised.   Labs unremarkable with nl WBC, no shift  Additional nebulizer with Xopenex ordered  7:57 PM pt wants to go home to continue additional nebulizers, will Rx tapering Prednisone, to f/u with Pulmonary in 2 days    IMP:URI, COPD Exacerbation  Plan:Steroids, nebulizers, antibx  Cond:Stable

## 2018-04-08 NOTE — ED Notes (Signed)
Physical assessment completed. The patient???s level of consciousness is alert. The patient???s mood is calm and cooperative. The patient???s appearance shows normal skin assessment. The patient does not  indicate signs or symptoms of abuse or neglect.

## 2018-04-08 NOTE — ED Provider Notes (Signed)
CC:  SOB and cough    HPI:     Patient is a 67 y.o. female who presents with nonprod cough and increased SOB over the past 3d.  Pt notes andterior chest pain with cough    Sx present for   3  days        Pt denies: HA, sore throat, N/V or abd pain or increaed leg edema or pain          PCP:  Lucas Mallow, MD    PMHx:    Past Medical History:   Diagnosis Date   ??? Cancer ([place])     bone cancer?   ??? Chronic narcotic use    ??? Chronic obstructive pulmonary disease ([place])    ??? Chronic pain    ??? Closed fracture of lateral portion of left tibial plateau 04/06/2015   ??? Constipation    ??? Falls frequently    ??? Fracture, tibial plateau 2016    left   ??? Hx MRSA infection 2014    left foot   ??? Hyperlipemia    ??? Migraine    ??? Morbid obesity ([place])    ??? Panic attacks    ??? Psychiatric disorder     depression   ??? Stress incontinence    ??? Thyroid disease     hypothroidism       PSHx:  Past Surgical History:   Procedure Laterality Date   ??? HX APPENDECTOMY     ??? HX CHOLECYSTECTOMY     ??? HX GASTRIC BYPASS  2006    bypass   ??? HX HYSTERECTOMY     ??? HX KNEE ARTHROSCOPY Left    ??? HX KNEE REPLACEMENT Right    ??? HX ORTHOPAEDIC Left     orif ankle hardware-removed x 4   ??? HX OTHER SURGICAL Left 2014    debridement of foot wound -mrsa positive   ??? HX WRIST FRACTURE TX Right 2015   ??? PLASTY TIBIAL PLATEAU+DEBRIDE Left 2016       Soc Hx /  Fam Hx:  Social History     Socioeconomic History   ??? Marital status: DIVORCED     Spouse name: Not on file   ??? Number of children: Not on file   ??? Years of education: Not on file   ??? Highest education level: Not on file   Tobacco Use   ??? Smoking status: Never Smoker   ??? Smokeless tobacco: Never Used   Substance and Sexual Activity   ??? Alcohol use: No   ??? Drug use: No     History reviewed. No pertinent family history.    ROS:  Const:  no fever,  no fatigue  Eyes;  no pain, no vision changes  Orophar:  no sore throat, no ear pain  Resp:  +cough,  + SOB  Card:  no chest pain,  no palpitations   Abd:  no vomiting,  no diarrhea  GU:  no dysuria, no hematuria  Musc:  no back pain, no joint pain  Neuro:  no headache,  no numbness  Skin:  no rash,  no petechia      PE:  Vs:   per RN notes  Visit Vitals  BP 150/64 (BP 1 Location: Left arm, BP Patient Position: At rest)   Pulse 94   Temp 98 ??F (36.7 ??C)   Resp 18   Ht 5' 6"  (1.676 m)   Wt 121.6 kg (268 lb)   SpO2 95%  BMI 43.26 kg/m??     Head:  NCAT  Const:  WDWN, no distress  Eyes:  conj pink,  anicteric  Orophar:  no edema,  no erythema  Neck:  trachea midline, supple  Resp:  nl excursions,  normal BS,  no rales, diffuse exp wheezes  Card:  nl rate,  regular rhythm,  no murmur,   Good cap refill in all extrem (< 3 sec)  Abd:  non-distended,  soft,  tenderness = none    Back:  supple,  not tender  GU:  Musc:  +1 b leg edema, no cyanosis    Neuro:  awake and alert   CN:  no facial asymmetry   Motor:  symmetric strength in upper and lower extrem       Skin:  warm, moist, no rash  Psych:  calm, normal affect      Lab results:  Recent Results (from the past 24 hour(s))   CBC WITH AUTOMATED DIFF    Collection Time: 04/08/18  6:52 PM   Result Value Ref Range    WBC 8.2 4.8 - 10.6 K/uL    RBC 5.01 4.20 - 5.40 M/uL    HGB 14.2 12.0 - 16.0 g/dL    HCT 44.9 36.0 - 47.0 %    MCV 89.6 81.0 - 94.0 FL    MCH 28.3 27.0 - 35.0 PG    MCHC 31.6 30.7 - 37.3 g/dL    RDW 13.6 11.5 - 14.0 %    PLATELET 410 (H) 130 - 400 K/uL    MPV 9.8 9.2 - 11.8 FL    NRBC 0.0 0 PER 100 WBC    ABSOLUTE NRBC 0.00 0.0 - 0.01 K/uL    NEUTROPHILS 42 (L) 48.0 - 72.0 %    LYMPHOCYTES 44 (H) 18.0 - 40.0 %    MONOCYTES 8 2.0 - 12.0 %    EOSINOPHILS 4 0.0 - 7.0 %    BASOPHILS 1 0.0 - 3.0 %    IMMATURE GRANULOCYTES 0 0.0 - 0.5 %    ABS. NEUTROPHILS 3.5 2.3 - 7.6 K/UL    ABS. LYMPHOCYTES 3.6 0.9 - 4.2 K/UL    ABS. MONOCYTES 0.7 0.1 - 1.7 K/UL    ABS. EOSINOPHILS 0.4 0.0 - 1.0 K/UL    ABS. BASOPHILS 0.1 0.0 - 0.4 K/UL    ABS. IMM. GRANS. 0.0 0.0 - 0.17 K/UL    DF AUTOMATED     METABOLIC PANEL, COMPREHENSIVE     Collection Time: 04/08/18  6:52 PM   Result Value Ref Range    Sodium 143 136 - 145 mmol/L    Potassium 3.2 (L) 3.5 - 5.1 mmol/L    Chloride 105 98 - 107 mmol/L    CO2 27 21 - 32 mmol/L    Anion gap 11 4 - 12 mmol/L    Glucose 110 (H) 74 - 106 mg/dL    BUN 7 7 - 18 mg/dL    Creatinine 0.83 0.55 - 1.02 mg/dL    GFR est AA >60 >60 ml/min/1.37m    GFR est non-AA >60 >60 ml/min/1.755m   Calcium 9.2 8.5 - 10.1 mg/dL    Bilirubin, total 0.3 0.2 - 1.0 mg/dL    ALT (SGPT) 14 12 - 78 U/L    AST (SGOT) 15 15 - 37 U/L    Alk. phosphatase 93 46 - 116 U/L    Protein, total 7.2 6.4 - 8.2 g/dL    Albumin 3.3 (L) 3.4 - 5.0  g/dL    Globulin 3.9 2.8 - 3.9 g/dL    A-G Ratio 0.8 (L) 1.0 - 1.5     EKG, 12 LEAD, INITIAL    Collection Time: 04/08/18  7:28 PM   Result Value Ref Range    Ventricular Rate 81 BPM    Atrial Rate 81 BPM    P-R Interval 136 ms    QRS Duration 70 ms    Q-T Interval 372 ms    QTC Calculation (Bezet) 432 ms    Calculated P Axis 29 degrees    Calculated R Axis -5 degrees    Calculated T Axis 45 degrees    Diagnosis       Normal sinus rhythm  Nonspecific ST abnormality  Abnormal ECG  No previous ECGs available             Rad results:  XR CHEST PA LAT   Final Result   IMPRESSION: No acute pulmonary disease. If the patient has persistent symptoms   then a follow-up chest x-ray or CT scan would be advised.                  ED Course: Case endorse to Dr Roche pending  CXR, Labs and reeval    Visit Vitals  BP 150/64 (BP 1 Location: Left arm, BP Patient Position: At rest)   Pulse 94   Temp 98 ??F (36.7 ??C)   Resp 18   Ht 5' 6"  (1.676 m)   Wt 121.6 kg (268 lb)   SpO2 95%   BMI 43.26 kg/m??          Medications   predniSONE (DELTASONE) tablet 20 mg (has no administration in time range)   levalbuterol (XOPENEX) nebulizer soln 1.25 mg/3 mL (has no administration in time range)   albuterol-ipratropium (DUO-NEB) 2.5 MG-0.5 MG/3 ML (3 mL Nebulization Given 04/08/18 1925)    predniSONE (DELTASONE) tablet 60 mg (60 mg Oral Given 04/08/18 1929)           Critical care time (if provided) excluding procedural time was  0  minutes.    FINAL DX:  Diagnoses that have been ruled out:   None   Diagnoses that are still under consideration:   None   Final diagnoses:   COPD exacerbation ([place])   Acute bronchitis, unspecified organism       DISPO:   pending  Rx:   I have reviewed the following home medications:  Prior to Admission medications    Medication Sig Start Date End Date Taking? Authorizing Provider   fluticasone furoate-vilanterol (BREO ELLIPTA) 200-25 mcg/dose inhaler Take 1 Puff by inhalation daily.   Yes Other, Phys, MD   albuterol (PROAIR HFA) 90 mcg/actuation inhaler Take 2 Puffs by inhalation two (2) times a day.   Yes Provider, Historical   albuterol (PROVENTIL VENTOLIN) 2.5 mg /3 mL (0.083 %) nebulizer solution 2.5 mg by Nebulization route once.   Yes Provider, Historical   docusate sodium (COLACE) 100 mg capsule Take 100 mg by mouth daily.   Yes Provider, Historical   aspirin 81 mg chewable tablet Take 81 mg by mouth daily.   Yes Provider, Historical   esomeprazole (NEXIUM) 40 mg capsule Take 40 mg by mouth daily.   Yes Provider, Historical   ALPRAZolam (XANAX) 1 mg tablet Take 1 mg by mouth three (3) times daily as needed for Anxiety.   Yes Provider, Historical   pregabalin (LYRICA) 50 mg capsule Take 50 mg by mouth two (2)  times a day.   Yes Provider, Historical   lactulose (CHRONULAC) 10 gram/15 mL solution Take  by mouth daily.   Yes Provider, Historical   levothyroxine (SYNTHROID) 137 mcg tablet Take 137 mcg by mouth Daily (before breakfast).   Yes Provider, Historical   zolpidem CR (AMBIEN CR) 12.5 mg tablet Take 12.5 mg by mouth nightly as needed for Sleep.   Yes Provider, Historical   onabotulinumtoxinA (BOTOX) 100 unit injection once. Indications: MIGRAINE PREVENTION    Provider, Historical   montelukast (SINGULAIR) 10 mg tablet Take 10 mg by mouth daily.     Provider, Historical   dicyclomine (BENTYL) 20 mg tablet Take 20 mg by mouth as needed.    Provider, Historical   simvastatin (ZOCOR) 10 mg tablet Take 10 mg by mouth nightly.    Provider, Historical   escitalopram oxalate (LEXAPRO) 20 mg tablet Take 20 mg by mouth daily.    Provider, Historical   diclofenac (VOLTAREN) 1 % gel Apply  to affected area three (3) times daily.    Provider, Historical   SUMAtriptan (IMITREX) 100 mg tablet Take 100 mg by mouth once as needed for Migraine.    Provider, Historical   morphine CR (MS CONTIN) 15 mg CR tablet Take 15 mg by mouth three (3) times daily as needed.    Provider, Historical   hydrOXYzine (ATARAX) 25 mg tablet Take 25 mg by mouth three (3) times daily as needed for Itching.    Provider, Historical       Ascencion Dike, MD

## 2018-04-08 NOTE — ED Notes (Signed)
1930  Verbal bedside report received from of-going shift colleen s rn   Pt was seen in ed for copd exacerbation   Was medicated as ordered  Labs and tests were negative for acute abnormalities   2031  Pt was re-eval by ed provider and discharged home in stable condition.  the patient verbalized understanding on discharge,f/u , rx. . Opportunity was provided to ask questions .  Left ed with family member

## 2018-04-09 LAB — CBC WITH AUTO DIFFERENTIAL
Basophils %: 1 % (ref 0.0–3.0)
Basophils Absolute: 0.1 10*3/uL (ref 0.0–0.4)
Eosinophils %: 4 % (ref 0.0–7.0)
Eosinophils Absolute: 0.4 10*3/uL (ref 0.0–1.0)
Granulocyte Absolute Count: 0 10*3/uL (ref 0.0–0.17)
Hematocrit: 44.9 % (ref 36.0–47.0)
Hemoglobin: 14.2 g/dL (ref 12.0–16.0)
Immature Granulocytes %: 0 % (ref 0.0–0.5)
Lymphocytes %: 44 % — ABNORMAL HIGH (ref 18.0–40.0)
Lymphocytes Absolute: 3.6 10*3/uL (ref 0.9–4.2)
MCH: 28.3 PG (ref 27.0–35.0)
MCHC: 31.6 g/dL (ref 30.7–37.3)
MCV: 89.6 FL (ref 81.0–94.0)
MPV: 9.8 FL (ref 9.2–11.8)
Monocytes %: 8 % (ref 2.0–12.0)
Monocytes Absolute: 0.7 10*3/uL (ref 0.1–1.7)
NRBC Absolute: 0 10*3/uL (ref 0.0–0.01)
Neutrophils %: 42 % — ABNORMAL LOW (ref 48.0–72.0)
Neutrophils Absolute: 3.5 10*3/uL (ref 2.3–7.6)
Nucleated RBCs: 0 PER 100 WBC
Platelets: 410 10*3/uL — ABNORMAL HIGH (ref 130–400)
RBC: 5.01 M/uL (ref 4.20–5.40)
RDW: 13.6 % (ref 11.5–14.0)
WBC: 8.2 10*3/uL (ref 4.8–10.6)

## 2018-04-09 LAB — COMPREHENSIVE METABOLIC PANEL
ALT: 14 U/L (ref 12–78)
AST: 15 U/L (ref 15–37)
Albumin/Globulin Ratio: 0.8 — ABNORMAL LOW (ref 1.0–1.5)
Albumin: 3.3 g/dL — ABNORMAL LOW (ref 3.4–5.0)
Alkaline Phosphatase: 93 U/L (ref 46–116)
Anion Gap: 11 mmol/L (ref 4–12)
BUN: 7 mg/dL (ref 7–18)
CO2: 27 mmol/L (ref 21–32)
Calcium: 9.2 mg/dL (ref 8.5–10.1)
Chloride: 105 mmol/L (ref 98–107)
Creatinine: 0.83 mg/dL (ref 0.55–1.02)
GFR African American: >60 mL/min/{1.73_m2} (ref >60)
Globulin: 3.9 g/dL (ref 2.8–3.9)
Glucose: 110 mg/dL — ABNORMAL HIGH (ref 74–106)
Potassium: 3.2 mmol/L — ABNORMAL LOW (ref 3.5–5.1)
Sodium: 143 mmol/L (ref 136–145)
Total Bilirubin: 0.3 mg/dL (ref 0.2–1.0)
Total Protein: 7.2 g/dL (ref 6.4–8.2)
eGFR NON-AA: >60 mL/min/{1.73_m2} (ref >60)

## 2018-04-09 LAB — EKG 12-LEAD
Atrial Rate: 81 {beats}/min
Diagnosis: NORMAL
P Axis: 29 degrees
P-R Interval: 136 ms
Q-T Interval: 372 ms
QRS Duration: 70 ms
QTc Calculation (Bazett): 432 ms
R Axis: -5 degrees
T Axis: 45 degrees
Ventricular Rate: 81 {beats}/min

## 2018-04-09 LAB — METABOLIC PANEL, COMPREHENSIVE
A-G Ratio: 0.8 — ABNORMAL LOW (ref 1.0–1.5)
ALT (SGPT): 14 U/L (ref 12–78)
AST (SGOT): 15 U/L (ref 15–37)
Albumin: 3.3 g/dL — ABNORMAL LOW (ref 3.4–5.0)
Alk. phosphatase: 93 U/L (ref 46–116)
Anion gap: 11 mmol/L (ref 4–12)
BUN: 7 mg/dL (ref 7–18)
Bilirubin, total: 0.3 mg/dL (ref 0.2–1.0)
CO2: 27 mmol/L (ref 21–32)
Calcium: 9.2 mg/dL (ref 8.5–10.1)
Chloride: 105 mmol/L (ref 98–107)
Creatinine: 0.83 mg/dL (ref 0.55–1.02)
GFR est AA: >60 mL/min/{1.73_m2} (ref >60)
GFR est non-AA: >60 mL/min/{1.73_m2} (ref >60)
Globulin: 3.9 g/dL (ref 2.8–3.9)
Glucose: 110 mg/dL — ABNORMAL HIGH (ref 74–106)
Potassium: 3.2 mmol/L — ABNORMAL LOW (ref 3.5–5.1)
Protein, total: 7.2 g/dL (ref 6.4–8.2)
Sodium: 143 mmol/L (ref 136–145)

## 2018-04-09 LAB — EKG, 12 LEAD, INITIAL
Atrial Rate: 81 {beats}/min
Calculated P Axis: 29 degrees
Calculated R Axis: -5 degrees
Calculated T Axis: 45 degrees
Diagnosis: NORMAL
P-R Interval: 136 ms
Q-T Interval: 372 ms
QRS Duration: 70 ms
QTC Calculation (Bezet): 432 ms
Ventricular Rate: 81 {beats}/min

## 2018-04-09 LAB — CBC WITH AUTOMATED DIFF
ABS. BASOPHILS: 0.1 10*3/uL (ref 0.0–0.4)
ABS. EOSINOPHILS: 0.4 10*3/uL (ref 0.0–1.0)
ABS. IMM. GRANS.: 0 10*3/uL (ref 0.0–0.17)
ABS. LYMPHOCYTES: 3.6 10*3/uL (ref 0.9–4.2)
ABS. MONOCYTES: 0.7 10*3/uL (ref 0.1–1.7)
ABS. NEUTROPHILS: 3.5 10*3/uL (ref 2.3–7.6)
ABSOLUTE NRBC: 0 10*3/uL (ref 0.0–0.01)
BASOPHILS: 1 % (ref 0.0–3.0)
EOSINOPHILS: 4 % (ref 0.0–7.0)
HCT: 44.9 % (ref 36.0–47.0)
HGB: 14.2 g/dL (ref 12.0–16.0)
IMMATURE GRANULOCYTES: 0 % (ref 0.0–0.5)
LYMPHOCYTES: 44 % — ABNORMAL HIGH (ref 18.0–40.0)
MCH: 28.3 PG (ref 27.0–35.0)
MCHC: 31.6 g/dL (ref 30.7–37.3)
MCV: 89.6 FL (ref 81.0–94.0)
MONOCYTES: 8 % (ref 2.0–12.0)
MPV: 9.8 FL (ref 9.2–11.8)
NEUTROPHILS: 42 % — ABNORMAL LOW (ref 48.0–72.0)
NRBC: 0 PER 100 WBC
PLATELET: 410 10*3/uL — ABNORMAL HIGH (ref 130–400)
RBC: 5.01 M/uL (ref 4.20–5.40)
RDW: 13.6 % (ref 11.5–14.0)
WBC: 8.2 10*3/uL (ref 4.8–10.6)

## 2018-04-09 MED ORDER — ALUM-MAG HYDROXIDE-SIMETH 200 MG-200 MG-20 MG/5 ML ORAL SUSP
200-200-20 mg/5 mL | ORAL | Status: AC
Start: 2018-04-09 — End: 2018-04-08
  Administered 2018-04-09: 01:00:00 via ORAL

## 2018-04-09 MED ORDER — PREDNISONE 20 MG TAB
20 mg | ORAL | Status: AC
Start: 2018-04-09 — End: 2018-04-08
  Administered 2018-04-09: 01:00:00 via ORAL

## 2018-04-09 MED ORDER — AZITHROMYCIN 500 MG TAB
500 mg | ORAL_TABLET | Freq: Every day | ORAL | 0 refills | Status: AC
Start: 2018-04-09 — End: 2018-04-11

## 2018-04-09 MED ORDER — PREDNISONE 20 MG TAB
20 mg | ORAL | Status: AC
Start: 2018-04-09 — End: 2018-04-08
  Administered 2018-04-09: via ORAL

## 2018-04-09 MED ORDER — LEVALBUTEROL 1.25 MG/3 ML SOLN FOR INHALATION
1.25 mg/3 mL | RESPIRATORY_TRACT | Status: AC
Start: 2018-04-09 — End: 2018-04-08
  Administered 2018-04-09: 01:00:00 via RESPIRATORY_TRACT

## 2018-04-09 MED ORDER — PREDNISONE 20 MG TAB
20 mg | ORAL_TABLET | ORAL | 0 refills | Status: AC
Start: 2018-04-09 — End: ?

## 2018-04-09 MED FILL — LEVALBUTEROL 1.25 MG/3 ML SOLN FOR INHALATION: 1.25 mg/3 mL | RESPIRATORY_TRACT | Qty: 3

## 2018-04-09 MED FILL — SOLU-MEDROL (PF) 125 MG/2 ML SOLUTION FOR INJECTION: 125 mg/2 mL | INTRAMUSCULAR | Qty: 2

## 2018-04-09 MED FILL — MAG-AL PLUS 200 MG-200 MG-20 MG/5 ML ORAL SUSPENSION: 200-200-20 mg/5 mL | ORAL | Qty: 30

## 2018-04-09 MED FILL — PREDNISONE 20 MG TAB: 20 mg | ORAL | Qty: 3

## 2018-04-09 MED FILL — PREDNISONE 20 MG TAB: 20 mg | ORAL | Qty: 1

## 2018-04-09 MED FILL — IPRATROPIUM-ALBUTEROL 2.5 MG-0.5 MG/3 ML NEB SOLUTION: 2.5 mg-0.5 mg/3 ml | RESPIRATORY_TRACT | Qty: 3

## 2018-12-15 ENCOUNTER — Other Ambulatory Visit: Payer: Self-pay | Admitting: Family Medicine

## 2018-12-15 DIAGNOSIS — Z13820 Encounter for screening for osteoporosis: Secondary | ICD-10-CM

## 2018-12-15 DIAGNOSIS — Z1231 Encounter for screening mammogram for malignant neoplasm of breast: Secondary | ICD-10-CM

## 2019-05-26 ENCOUNTER — Other Ambulatory Visit: Payer: Self-pay

## 2019-05-26 ENCOUNTER — Ambulatory Visit: Payer: Self-pay

## 2019-07-22 ENCOUNTER — Institutional Professional Consult (permissible substitution): Payer: Medicare Other | Admitting: Pulmonary Disease

## 2019-08-16 ENCOUNTER — Encounter: Payer: Self-pay | Admitting: Pulmonary Disease

## 2019-08-16 ENCOUNTER — Other Ambulatory Visit: Payer: Self-pay

## 2019-08-16 ENCOUNTER — Ambulatory Visit (INDEPENDENT_AMBULATORY_CARE_PROVIDER_SITE_OTHER): Payer: Medicare Other | Admitting: Pulmonary Disease

## 2019-08-16 ENCOUNTER — Telehealth: Payer: Self-pay | Admitting: Pulmonary Disease

## 2019-08-16 VITALS — BP 132/64 | HR 94 | Ht 66.0 in | Wt 251.0 lb

## 2019-08-16 DIAGNOSIS — R0602 Shortness of breath: Secondary | ICD-10-CM | POA: Diagnosis not present

## 2019-08-16 DIAGNOSIS — G4719 Other hypersomnia: Secondary | ICD-10-CM

## 2019-08-16 DIAGNOSIS — Z9989 Dependence on other enabling machines and devices: Secondary | ICD-10-CM

## 2019-08-16 DIAGNOSIS — G4700 Insomnia, unspecified: Secondary | ICD-10-CM

## 2019-08-16 DIAGNOSIS — G4733 Obstructive sleep apnea (adult) (pediatric): Secondary | ICD-10-CM

## 2019-08-16 MED ORDER — ZOLPIDEM TARTRATE ER 12.5 MG PO TBCR: 12.5000 mg | EXTENDED_RELEASE_TABLET | Freq: Every evening | ORAL | 1 refills | Status: DC | PRN

## 2019-08-16 MED ORDER — ALBUTEROL SULFATE 0.63 MG/3ML IN NEBU: 1.0000 | INHALATION_SOLUTION | Freq: Four times a day (QID) | RESPIRATORY_TRACT | 1 refills | Status: DC | PRN

## 2019-08-16 NOTE — Addendum Note (Signed)
Addended by: Jacquiline Doe on: 08/16/2019 03:00 PM   Modules accepted: Orders

## 2019-08-16 NOTE — Telephone Encounter (Signed)
PA request received from: patient's daughter  Drug requested: Ambien 12.5mg  tab CMM Key: [id number] Tried/failed: Covered alternatives: Belsomra, Trazodone PA request has been sent to plan, and a determination is expected within 3 days.   Routing to triage for follow-up.

## 2019-08-16 NOTE — Patient Instructions (Signed)
Obstructive sleep apnea  We need to obtain some information from your previous doctor to ascertain what pressure settings you are on  Continue your CPAP  Insomnia  Prescription for Ambien 12.5 sent to pharmacy  Try and sleep about the same time every night Avoid daytime naps  We will see you back in about 4 weeks

## 2019-08-16 NOTE — Progress Notes (Addendum)
Jennifer Clay    [id number]    1950/10/12  Primary Care Physician:Patient, No Pcp Per  Referring Physician: Buzzy Clay, Grandfather,  [place] [id number]  Chief complaint:   Patient being seen for obstructive sleep apnea Insomnia  HPI:  Obstructive sleep apnea for which he uses CPAP Diagnosed in 2019 Compliant with CPAP use Feels CPAP continues to help  She does have insomnia and requires Ambien She literally has not slept well for the last many months secondary to not having her usual Ambien to help her sleep  Poor sleep routine as she is not able to sleep without a sleep aid Frequent awakenings, sometimes not sleeping at all  Weight is stable  Relocated from [place]  She does have a history of asthma-symptoms well controlled with bronchodilators   Non-smoker  Outpatient Encounter Medications as of 08/16/2019  Medication Sig  . albuterol (ACCUNEB) 0.63 MG/3ML nebulizer solution Take 3 mLs (0.63 mg total) by nebulization every 6 (six) hours as needed for wheezing.  Marland Kitchen aspirin 325 MG tablet Take 325 mg by mouth daily.  Marland Kitchen dicyclomine (BENTYL) 20 MG tablet Take 20 mg by mouth every 6 (six) hours as needed for spasms.  Marland Kitchen esomeprazole (NEXIUM) 40 MG capsule Take 40 mg by mouth daily at 12 noon.  . fluticasone furoate-vilanterol (BREO ELLIPTA) 200-25 MCG/INH AEPB Inhale 1 puff into the lungs daily.  Marland Kitchen HM LIDOCAINE PATCH EX Apply topically as needed.  Marland Kitchen levothyroxine (SYNTHROID) 150 MCG tablet Take 150 mcg by mouth daily before breakfast.  . montelukast (SINGULAIR) 10 MG tablet Take 10 mg by mouth at bedtime.  . OnabotulinumtoxinA (BOTOX IJ) Inject as directed every 3 (three) months.  . pregabalin (LYRICA) 100 MG capsule Take 100 mg by mouth 2 (two) times daily.  . traMADol (ULTRAM) 50 MG tablet Take by mouth every 6 (six) hours as needed.  . [DISCONTINUED] albuterol (ACCUNEB) 0.63 MG/3ML nebulizer solution Take 1 ampule by  nebulization every 6 (six) hours as needed for wheezing.  Marland Kitchen zolpidem (AMBIEN CR) 12.5 MG CR tablet Take 1 tablet (12.5 mg total) by mouth at bedtime as needed for sleep.   No facility-administered encounter medications on file as of 08/16/2019.    Allergies as of 08/16/2019  . (No Known Allergies)    No past medical history on file.    No family history on file.  Social History   Socioeconomic History  . Marital status: Divorced    Spouse name: Not on file  . Number of children: Not on file  . Years of education: Not on file  . Highest education level: Not on file  Occupational History  . Not on file  Tobacco Use  . Smoking status: Never Smoker  . Smokeless tobacco: Never Used  Substance and Sexual Activity  . Alcohol use: Not on file  . Drug use: Not on file  . Sexual activity: Not on file  Other Topics Concern  . Not on file  Social History Narrative  . Not on file   Social Determinants of Health   Financial Resource Strain:   . Difficulty of Paying Living Expenses:   Food Insecurity:   . Worried About Charity fundraiser in the Last Year:   . Arboriculturist in the Last Year:   Transportation Needs:   . Film/video editor (Medical):   Marland Kitchen Lack of Transportation (Non-Medical):   Physical Activity:   . Days  of Exercise per Week:   . Minutes of Exercise per Session:   Stress:   . Feeling of Stress :   Social Connections:   . Frequency of Communication with Friends and Family:   . Frequency of Social Gatherings with Friends and Family:   . Attends Religious Services:   . Active Member of Clubs or Organizations:   . Attends Banker Meetings:   Marland Kitchen Marital Status:   Intimate Partner Violence:   . Fear of Current or Ex-Partner:   . Emotionally Abused:   Marland Kitchen Physically Abused:   . Sexually Abused:     Review of Systems  Constitutional: Positive for fatigue.  HENT: Negative.   Respiratory: Positive for apnea.   Cardiovascular: Negative.     Psychiatric/Behavioral: Positive for sleep disturbance.    Vitals:   08/16/19 1423  BP: 132/64  Pulse: 94  SpO2: 97%   No flowsheet data found.  Epworth score of 21  Physical Exam  Constitutional: She is oriented to person, place, and time. She appears well-developed and well-nourished.  HENT:  Head: Normocephalic and atraumatic.  Mallampati 3, crowded oropharynx  Eyes: Pupils are equal, round, and reactive to light. Conjunctivae are normal. Right eye exhibits no discharge. Left eye exhibits no discharge.  Neck: No tracheal deviation present. No thyromegaly present.  Cardiovascular: Normal rate and regular rhythm.  Pulmonary/Chest: Effort normal and breath sounds normal. No respiratory distress. She has no wheezes. She has no rales. She exhibits no tenderness.  Musculoskeletal:        General: No edema. Normal range of motion.     Cervical back: Normal range of motion and neck supple.  Neurological: She is alert and oriented to person, place, and time.  Skin: Skin is warm. No erythema.   Assessment:  History of obstructive sleep apnea  -Continue CPAP use  Chronic insomnia  -Prescription for Ambien 12.5  Poor sleep hygiene -Encouraged to turn develop a structured sleep routine  Asthma -Well-controlled on Breo and albuterol  Plan/Recommendations: We will try and get some information regarding sleep study and current pressure settings She does not know whether there is any caught in the machine at present  We will see her back in about 4 weeks  Encouraged to call with any significant concerns   Jennifer Diamond MD [facility] 08/16/2019, 2:45 PM  CC: Jennifer Clay*

## 2019-08-17 NOTE — Telephone Encounter (Signed)
PA has been approved from 08/16/2019 - 06/02/2020.   Pharmacy aware.  Pt daughter aware.  Nothing further needed at this time- will close encounter.

## 2019-08-19 ENCOUNTER — Ambulatory Visit
Admission: RE | Admit: 2019-08-19 | Discharge: 2019-08-19 | Disposition: A | Discharge status: Home / Self Care | Payer: Medicare Other | Source: Ambulatory Visit | Attending: Family Medicine | Admitting: Family Medicine

## 2019-08-19 ENCOUNTER — Other Ambulatory Visit: Payer: Self-pay

## 2019-08-19 DIAGNOSIS — Z1231 Encounter for screening mammogram for malignant neoplasm of breast: Secondary | ICD-10-CM

## 2019-08-19 DIAGNOSIS — Z13820 Encounter for screening for osteoporosis: Secondary | ICD-10-CM

## 2019-10-05 ENCOUNTER — Emergency Department (HOSPITAL_BASED_OUTPATIENT_CLINIC_OR_DEPARTMENT_OTHER): Payer: Medicare Other

## 2019-10-05 ENCOUNTER — Emergency Department (HOSPITAL_BASED_OUTPATIENT_CLINIC_OR_DEPARTMENT_OTHER)
Admission: EM | Admit: 2019-10-05 | Discharge: 2019-10-05 | Disposition: A | Discharge status: Home / Self Care | Payer: Medicare Other | Attending: Emergency Medicine | Admitting: Emergency Medicine

## 2019-10-05 ENCOUNTER — Other Ambulatory Visit: Payer: Self-pay

## 2019-10-05 ENCOUNTER — Encounter (HOSPITAL_BASED_OUTPATIENT_CLINIC_OR_DEPARTMENT_OTHER): Payer: Self-pay | Admitting: *Deleted

## 2019-10-05 DIAGNOSIS — K279 Peptic ulcer, site unspecified, unspecified as acute or chronic, without hemorrhage or perforation: Secondary | ICD-10-CM

## 2019-10-05 DIAGNOSIS — R1013 Epigastric pain: Secondary | ICD-10-CM

## 2019-10-05 DIAGNOSIS — Z7982 Long term (current) use of aspirin: Secondary | ICD-10-CM | POA: Insufficient documentation

## 2019-10-05 DIAGNOSIS — Z79899 Other long term (current) drug therapy: Secondary | ICD-10-CM | POA: Insufficient documentation

## 2019-10-05 HISTORY — DX: Other chronic pain: G89.29

## 2019-10-05 HISTORY — DX: Disorder of thyroid, unspecified: E07.9

## 2019-10-05 LAB — CBC WITH DIFFERENTIAL/PLATELET
Abs Immature Granulocytes: 0.05 10*3/uL (ref 0.00–0.07)
Basophils Absolute: 0.1 10*3/uL (ref 0.0–0.1)
Basophils Relative: 1 %
Eosinophils Absolute: 0.1 10*3/uL (ref 0.0–0.5)
Eosinophils Relative: 1 %
HCT: 43.9 % (ref 36.0–46.0)
Hemoglobin: 14.7 g/dL (ref 12.0–15.0)
Immature Granulocytes: 0 %
Lymphocytes Relative: 34 %
Lymphs Abs: 3.8 10*3/uL (ref 0.7–4.0)
MCH: 30.4 pg (ref 26.0–34.0)
MCHC: 33.5 g/dL (ref 30.0–36.0)
MCV: 90.7 fL (ref 80.0–100.0)
Monocytes Absolute: 0.9 10*3/uL (ref 0.1–1.0)
Monocytes Relative: 8 %
Neutro Abs: 6.3 10*3/uL (ref 1.7–7.7)
Neutrophils Relative %: 56 %
Platelets: 347 10*3/uL (ref 150–400)
RBC: 4.84 MIL/uL (ref 3.87–5.11)
RDW: 13.2 % (ref 11.5–15.5)
WBC: 11.2 10*3/uL — ABNORMAL HIGH (ref 4.0–10.5)
nRBC: 0 % (ref 0.0–0.2)

## 2019-10-05 LAB — URINALYSIS, ROUTINE W REFLEX MICROSCOPIC
Bilirubin Urine: NEGATIVE
Glucose, UA: NEGATIVE mg/dL
Hgb urine dipstick: NEGATIVE
Ketones, ur: NEGATIVE mg/dL
Nitrite: NEGATIVE
Protein, ur: NEGATIVE mg/dL
Specific Gravity, Urine: 1.01 (ref 1.005–1.030)
pH: 5.5 (ref 5.0–8.0)

## 2019-10-05 LAB — COMPREHENSIVE METABOLIC PANEL
ALT: 13 U/L (ref 0–44)
AST: 19 U/L (ref 15–41)
Albumin: 3.7 g/dL (ref 3.5–5.0)
Alkaline Phosphatase: 65 U/L (ref 38–126)
Anion gap: 10 (ref 5–15)
BUN: 12 mg/dL (ref 8–23)
CO2: 23 mmol/L (ref 22–32)
Calcium: 9 mg/dL (ref 8.9–10.3)
Chloride: 105 mmol/L (ref 98–111)
Creatinine, Ser: 0.56 mg/dL (ref 0.44–1.00)
GFR calc Af Amer: >60 mL/min (ref >60)
GFR calc non Af Amer: >60 mL/min (ref >60)
Glucose, Bld: 104 mg/dL — ABNORMAL HIGH (ref 70–99)
Potassium: 3.7 mmol/L (ref 3.5–5.1)
Sodium: 138 mmol/L (ref 135–145)
Total Bilirubin: 0.3 mg/dL (ref 0.3–1.2)
Total Protein: 6.9 g/dL (ref 6.5–8.1)

## 2019-10-05 LAB — URINALYSIS, MICROSCOPIC (REFLEX): RBC / HPF: NONE SEEN RBC/hpf (ref 0–5)

## 2019-10-05 LAB — LIPASE, BLOOD: Lipase: 26 U/L (ref 11–51)

## 2019-10-05 MED ORDER — ONDANSETRON 4 MG PO TBDP
4.0000 mg | ORAL_TABLET | Freq: Three times a day (TID) | ORAL | 0 refills | Status: DC | PRN
Start: 2019-10-05 — End: 2019-10-05

## 2019-10-05 MED ORDER — DICYCLOMINE HCL 20 MG PO TABS: 20.0000 mg | ORAL_TABLET | Freq: Four times a day (QID) | ORAL | 0 refills | Status: AC | PRN

## 2019-10-05 MED ORDER — ONDANSETRON HCL 4 MG/2ML IJ SOLN
4.0000 mg | Freq: Once | INTRAMUSCULAR | Status: AC
  Administered 2019-10-05: 16:00:00 4 mg via INTRAVENOUS
  Filled 2019-10-05: qty 2

## 2019-10-05 MED ORDER — SODIUM CHLORIDE 0.9 % IV BOLUS
1000.0000 mL | Freq: Once | INTRAVENOUS | Status: AC
  Administered 2019-10-05: 16:00:00 1000 mL via INTRAVENOUS

## 2019-10-05 MED ORDER — IOHEXOL 300 MG/ML  SOLN
100.0000 mL | Freq: Once | INTRAMUSCULAR | Status: AC | PRN
  Administered 2019-10-05: 17:00:00 100 mL via INTRAVENOUS

## 2019-10-05 MED ORDER — ALUM & MAG HYDROXIDE-SIMETH 200-200-20 MG/5ML PO SUSP
30.0000 mL | Freq: Once | ORAL | Status: AC
  Administered 2019-10-05: 18:00:00 30 mL via ORAL
  Filled 2019-10-05: qty 30

## 2019-10-05 MED ORDER — PANTOPRAZOLE SODIUM 20 MG PO TBEC: 40.0000 mg | DELAYED_RELEASE_TABLET | Freq: Two times a day (BID) | ORAL | 0 refills | Status: AC

## 2019-10-05 MED ORDER — ONDANSETRON 4 MG PO TBDP: 4.0000 mg | ORAL_TABLET | Freq: Three times a day (TID) | ORAL | 0 refills | Status: AC | PRN

## 2019-10-05 MED ORDER — MORPHINE SULFATE (PF) 4 MG/ML IV SOLN
4.0000 mg | Freq: Once | INTRAVENOUS | Status: AC
  Administered 2019-10-05: 16:00:00 4 mg via INTRAVENOUS
  Filled 2019-10-05: qty 1

## 2019-10-05 MED ORDER — LIDOCAINE VISCOUS HCL 2 % MT SOLN
15.0000 mL | Freq: Once | OROMUCOSAL | Status: AC
  Administered 2019-10-05: 18:00:00 15 mL via ORAL
  Filled 2019-10-05: qty 15

## 2019-10-05 MED ORDER — PANTOPRAZOLE SODIUM 20 MG PO TBEC
40.0000 mg | DELAYED_RELEASE_TABLET | Freq: Two times a day (BID) | ORAL | 0 refills | Status: DC
Start: 2019-10-05 — End: 2019-10-05

## 2019-10-05 MED FILL — ONDANSETRON ODT 4MG TBDP: 4 | 7 days supply | Qty: 20 | Fill #0

## 2019-10-05 MED FILL — DICYCLOMINE 20 MG TABLET: 20 | 7 days supply | Qty: 30 | Fill #0

## 2019-10-05 MED FILL — PANTOPRAZOLE SOD DR 20 MG T: 20 | 7 days supply | Qty: 30 | Fill #0

## 2019-10-05 NOTE — ED Triage Notes (Signed)
Abdominal pain, vomiting, diarrhea, weakness, fatigue, sob, cough, for months. She has not been tested for Covid.

## 2019-10-05 NOTE — ED Provider Notes (Signed)
[facility] EMERGENCY DEPARTMENT Provider Note   CSN: [id number] Arrival date & time: 10/05/19  1421     History Chief Complaint  Patient presents with  . Abdominal Pain    Jennifer Clay is a 69 y.o. female.  HPI      3 days of abdominal pain, burning Soft stool, vomiting, diarrhea Epigastric abdominal pain, stabbing/burn, no radiation Worse with eating Nothing makes it better No fevers 2-3 times nausea/small amount of emesis, not vomiting constantly 5 times per day, loose stool No urinary symptoms 10/10  No difficulty chest pain, dyspnea, cough Taken bentyl but that hasn't helped, pepto bismol, didn't help  Gallbladder, appendix removed, gastric bypass hx-over 45yr ago  Daughter at bedside helping with translation and patient declines offer for other translator   Past Medical History:  Diagnosis Date  . Chronic pain   . Thyroid disease     There are no problems to display for this patient.   Past Surgical History:  Procedure Laterality Date  . REDUCTION MAMMAPLASTY Bilateral 2000     OB History   No obstetric history on file.     No family history on file.  Social History   Tobacco Use  . Smoking status: Never Smoker  . Smokeless tobacco: Never Used  Substance Use Topics  . Alcohol use: Never  . Drug use: Never    Home Medications Prior to Admission medications   Medication Sig Start Date End Date Taking? Authorizing Provider  albuterol (ACCUNEB) 0.63 MG/3ML nebulizer solution Take 3 mLs (0.63 mg total) by nebulization every 6 (six) hours as needed for wheezing. 08/16/19   Virl Diamond A, MD  aspirin 325 MG tablet Take 325 mg by mouth daily.    [provider]  dicyclomine (BENTYL) 20 MG tablet Take 1 tablet (20 mg total) by mouth every 6 (six) hours as needed for spasms. 10/05/19   Alvira Monday, MD  esomeprazole (NEXIUM) 40 MG capsule Take 40 mg by mouth daily at 12 noon.    [provider]  fluticasone  furoate-vilanterol (BREO ELLIPTA) 200-25 MCG/INH AEPB Inhale 1 puff into the lungs daily.    [provider]  HM LIDOCAINE PATCH EX Apply topically as needed.    [provider]  levothyroxine (SYNTHROID) 150 MCG tablet Take 150 mcg by mouth daily before breakfast.    [provider]  montelukast (SINGULAIR) 10 MG tablet Take 10 mg by mouth at bedtime.    [provider]  OnabotulinumtoxinA (BOTOX IJ) Inject as directed every 3 (three) months.    [provider]  ondansetron (ZOFRAN ODT) 4 MG disintegrating tablet Take 1 tablet (4 mg total) by mouth every 8 (eight) hours as needed for nausea or vomiting. 10/05/19   Alvira Monday, MD  pantoprazole (PROTONIX) 20 MG tablet Take 2 tablets (40 mg total) by mouth 2 (two) times daily. 10/05/19   Alvira Monday, MD  pregabalin (LYRICA) 100 MG capsule Take 100 mg by mouth 2 (two) times daily.    [provider]  traMADol (ULTRAM) 50 MG tablet Take by mouth every 6 (six) hours as needed.    [provider]  zolpidem (AMBIEN CR) 12.5 MG CR tablet Take 1 tablet (12.5 mg total) by mouth at bedtime as needed for sleep. 08/16/19   Tomma Lightning, MD    Allergies    Patient has no known allergies.  Review of Systems   Review of Systems  Constitutional: Negative for fever.  HENT: Negative for  sore throat.   Eyes: Negative for visual disturbance.  Respiratory: Negative for cough and shortness of breath.   Cardiovascular: Negative for chest pain.  Gastrointestinal: Positive for abdominal pain, diarrhea, nausea and vomiting.  Genitourinary: Negative for difficulty urinating.  Musculoskeletal: Negative for back pain and neck pain.  Skin: Negative for rash.  Neurological: Negative for syncope and headaches.    Physical Exam Updated Vital Signs BP (!) 159/104 (BP Location: Right Wrist)   Pulse 74   Temp 98.3 F (36.8 C) (Oral)   Resp 16   Ht 5\' 6"  (1.676 m)   Wt 119.7 kg   SpO2 100%    BMI 42.61 kg/m   Physical Exam Vitals and nursing note reviewed.  Constitutional:      General: She is not in acute distress.    Appearance: She is well-developed. She is not diaphoretic.  HENT:     Head: Normocephalic and atraumatic.  Eyes:     Conjunctiva/sclera: Conjunctivae normal.  Cardiovascular:     Rate and Rhythm: Normal rate and regular rhythm.     Heart sounds: Normal heart sounds. No murmur. No friction rub. No gallop.   Pulmonary:     Effort: Pulmonary effort is normal. No respiratory distress.     Breath sounds: Normal breath sounds. No wheezing or rales.  Abdominal:     General: There is no distension.     Palpations: Abdomen is soft.     Tenderness: There is abdominal tenderness in the right upper quadrant, epigastric area and left upper quadrant. There is no guarding. Negative signs include McBurney's sign.  Musculoskeletal:        General: No tenderness.     Cervical back: Normal range of motion.  Skin:    General: Skin is warm and dry.     Findings: No erythema or rash.  Neurological:     Mental Status: She is alert and oriented to person, place, and time.     ED Results / Procedures / Treatments   Labs (all labs ordered are listed, but only abnormal results are displayed) Labs Reviewed  CBC WITH DIFFERENTIAL/PLATELET - Abnormal; Notable for the following components:      Result Value   WBC 11.2 (*)    All other components within normal limits  COMPREHENSIVE METABOLIC PANEL - Abnormal; Notable for the following components:   Glucose, Bld 104 (*)    All other components within normal limits  URINALYSIS, ROUTINE W REFLEX MICROSCOPIC - Abnormal; Notable for the following components:   Leukocytes,Ua TRACE (*)    All other components within normal limits  URINALYSIS, MICROSCOPIC (REFLEX) - Abnormal; Notable for the following components:   Bacteria, UA RARE (*)    All other components within normal limits  RESPIRATORY PANEL BY RT PCR (FLU A&B, COVID)    LIPASE, BLOOD    EKG EKG Interpretation  Date/Time:  Tuesday Oct 05 2019 16:14:09 EDT Ventricular Rate:  70 PR Interval:    QRS Duration: 133 QT Interval:  411 QTC Calculation: 444 R Axis:   19 Text Interpretation: Atrial fibrillation Nonspecific intraventricular conduction delay Interpretation limited secondary to artifact Confirmed by Gareth Morgan [phone]) on 10/05/2019 5:33:58 PM   Radiology CT ABDOMEN PELVIS W CONTRAST  Result Date: 10/05/2019 CLINICAL DATA:  Abdominal pain, vomiting, diarrhea, weakness EXAM: CT ABDOMEN AND PELVIS WITH CONTRAST TECHNIQUE: Multidetector CT imaging of the abdomen and pelvis was performed using the standard protocol following bolus administration of intravenous contrast. CONTRAST:  144mL OMNIPAQUE IOHEXOL 300  MG/ML  SOLN COMPARISON:  None. FINDINGS: Lower chest: No acute pleural or parenchymal lung disease. Hepatobiliary: No focal liver abnormality is seen. Status post cholecystectomy. No biliary dilatation. Pancreas: Unremarkable. No pancreatic ductal dilatation or surrounding inflammatory changes. Spleen: Normal in size without focal abnormality. Adrenals/Urinary Tract: Bilateral renal cortical thinning. Otherwise the kidneys enhance normally and symmetrically. No urinary tract calculi. The ureters and bladder are unremarkable. The adrenals are normal. Stomach/Bowel: Postsurgical changes are seen from previous bariatric surgery. No bowel obstruction or ileus. Diverticulosis of the descending and sigmoid colon without diverticulitis. Vascular/Lymphatic: Aortic atherosclerosis. No enlarged abdominal or pelvic lymph nodes. Reproductive: Status post hysterectomy. No adnexal masses. Other: No abdominal wall hernia or abnormality. No abdominopelvic ascites. Musculoskeletal: No acute or destructive bony lesions. Reconstructed images demonstrate no additional findings. IMPRESSION: 1. No acute intra-abdominal or intrapelvic process. 2. Diverticulosis without  diverticulitis. 3. Aortic Atherosclerosis (ICD10-I70.0). Electronically Signed   By: Sharlet Salina M.D.   On: 10/05/2019 16:59   DG Chest Portable 1 View  Result Date: 10/05/2019 CLINICAL DATA:  Abdominal pain. Nausea and vomiting. Shortness of breath. EXAM: PORTABLE CHEST 1 VIEW COMPARISON:  None. FINDINGS: The heart size and mediastinal contours are within normal limits. Both lungs are clear. The visualized skeletal structures are unremarkable. IMPRESSION: No active disease. Electronically Signed   By: Katherine Mantle M.D.   On: 10/05/2019 16:57    Procedures Procedures (including critical care time)  Medications Ordered in ED Medications  sodium chloride 0.9 % bolus 1,000 mL (0 mLs Intravenous Stopped 10/05/19 1717)  ondansetron (ZOFRAN) injection 4 mg (4 mg Intravenous Given 10/05/19 1610)  morphine 4 MG/ML injection 4 mg (4 mg Intravenous Given 10/05/19 1613)  iohexol (OMNIPAQUE) 300 MG/ML solution 100 mL (100 mLs Intravenous Contrast Given 10/05/19 1643)  alum & mag hydroxide-simeth (MAALOX/MYLANTA) 200-200-20 MG/5ML suspension 30 mL (30 mLs Oral Given 10/05/19 1740)    And  lidocaine (XYLOCAINE) 2 % viscous mouth solution 15 mL (15 mLs Oral Given 10/05/19 1740)    ED Course  I have reviewed the triage vital signs and the nursing notes.  Pertinent labs & imaging results that were available during my care of the patient were reviewed by me and considered in my medical decision making (see chart for details).    MDM Rules/Calculators/A&P                       69 year old female with a history of appendectomy, cholecystectomy, gastric bypass, presents with concern for epigastric abdominal pain.  CT abdomen pelvis shows no sign of obstruction or other acute abnormalities.  No sign of pancreatitis, hepatitis, choledocholithiasis or cholangitis.  No sign of urinary tract infection.  No chest pain or shortness of breath, and with abdominal tenderness on exam, doubt epigastric pain represents  myocardial ischemia.  EKG with artifact, however no sign of acute findings.  Suspect most likely etiology of epigastric pain worse with eating is gastritis or peptic ulcer disease. Possible preceding viral illness with loose stool/vomiting. Recommend follow-up with primary care physician and gastroenterology.  Will give prescription for Bentyl, twice daily PPI, and Zofran.    Final Clinical Impression(s) / ED Diagnoses Final diagnoses:  Epigastric pain  Peptic ulcer disease    Rx / DC Orders ED Discharge Orders         Ordered    ondansetron (ZOFRAN ODT) 4 MG disintegrating tablet  Every 8 hours PRN,   Status:  Discontinued     10/05/19 1744  pantoprazole (PROTONIX) 20 MG tablet  2 times daily,   Status:  Discontinued     10/05/19 1744    dicyclomine (BENTYL) 20 MG tablet  Every 6 hours PRN     10/05/19 1745    ondansetron (ZOFRAN ODT) 4 MG disintegrating tablet  Every 8 hours PRN     10/05/19 1745    pantoprazole (PROTONIX) 20 MG tablet  2 times daily     10/05/19 1745           Alvira Monday, MD 10/06/19 [phone]

## 2019-10-05 NOTE — ED Notes (Signed)
Pt on monitor 

## 2019-10-07 ENCOUNTER — Telehealth (HOSPITAL_BASED_OUTPATIENT_CLINIC_OR_DEPARTMENT_OTHER): Payer: Self-pay | Admitting: Emergency Medicine

## 2019-10-15 ENCOUNTER — Encounter: Payer: Self-pay | Admitting: Pulmonary Disease

## 2019-10-15 ENCOUNTER — Other Ambulatory Visit: Payer: Self-pay

## 2019-10-15 ENCOUNTER — Ambulatory Visit (INDEPENDENT_AMBULATORY_CARE_PROVIDER_SITE_OTHER): Payer: Medicare Other | Admitting: Pulmonary Disease

## 2019-10-15 DIAGNOSIS — G4700 Insomnia, unspecified: Secondary | ICD-10-CM

## 2019-10-15 DIAGNOSIS — G4733 Obstructive sleep apnea (adult) (pediatric): Secondary | ICD-10-CM | POA: Diagnosis not present

## 2019-10-15 DIAGNOSIS — Z9989 Dependence on other enabling machines and devices: Secondary | ICD-10-CM | POA: Diagnosis not present

## 2019-10-15 MED ORDER — ZOLPIDEM TARTRATE ER 12.5 MG PO TBCR: 12.5000 mg | EXTENDED_RELEASE_TABLET | Freq: Every evening | ORAL | 1 refills | Status: DC | PRN

## 2019-10-15 NOTE — Progress Notes (Signed)
Virtual Visit via Telephone Note  I connected with Jennifer Clay on 10/15/19 at  2:15 PM EDT by telephone and verified that I am speaking with the correct person using two identifiers.  Location: Patient: Jennifer Clay Provider: Wynona Neat   I discussed the limitations, risks, security and privacy concerns of performing an evaluation and management service by telephone and the availability of in person appointments. I also discussed with the patient that there may be a patient responsible charge related to this service. The patient expressed understanding and agreed to proceed.   History of Present Illness: She is doing well  No significant issues with her CPAP Ambien seems to be helping   Observations/Objective:  Sounds well on the phone  Assessment and Plan: Obstructive sleep apnea-compliant with CPAP use Insomnia-Ambien is helping  Follow Up Instructions: Follow-up in 3 months Continue Ambien  I discussed the assessment and treatment plan with the patient. The patient was provided an opportunity to ask questions and all were answered. The patient agreed with the plan and demonstrated an understanding of the instructions.   The patient was advised to call back or seek an in-person evaluation if the symptoms worsen or if the condition fails to improve as anticipated.  I provided 15 minutes of non-face-to-face time during this encounter.   Tomma Lightning, MD

## 2019-12-08 ENCOUNTER — Ambulatory Visit (INDEPENDENT_AMBULATORY_CARE_PROVIDER_SITE_OTHER): Payer: Medicare Other | Admitting: Primary Care

## 2019-12-08 ENCOUNTER — Other Ambulatory Visit: Payer: Self-pay

## 2019-12-08 DIAGNOSIS — G4733 Obstructive sleep apnea (adult) (pediatric): Secondary | ICD-10-CM

## 2019-12-08 DIAGNOSIS — G4700 Insomnia, unspecified: Secondary | ICD-10-CM | POA: Diagnosis not present

## 2019-12-08 DIAGNOSIS — Z9989 Dependence on other enabling machines and devices: Secondary | ICD-10-CM

## 2019-12-08 DIAGNOSIS — J45909 Unspecified asthma, uncomplicated: Secondary | ICD-10-CM | POA: Diagnosis not present

## 2019-12-08 MED ORDER — MONTELUKAST SODIUM 10 MG PO TABS: 10.0000 mg | ORAL_TABLET | Freq: Every day | ORAL | 0 refills | Status: DC

## 2019-12-08 MED ORDER — ZOLPIDEM TARTRATE ER 12.5 MG PO TBCR: 12.5000 mg | EXTENDED_RELEASE_TABLET | Freq: Every evening | ORAL | 0 refills | Status: DC | PRN

## 2019-12-08 NOTE — Assessment & Plan Note (Signed)
-   Maintained on Breo 200; Singulair and prn albuterol nebulizer  - She had an asthma exacerbation last month treated with oral prednisone prescribed by PCP

## 2019-12-08 NOTE — Progress Notes (Signed)
Virtual Visit via Telephone Note  I connected with Jennifer Clay on 12/08/19 at  2:30 PM EDT by telephone and verified that I am speaking with the correct person using two identifiers.  Location: Patient: Home Provider: Office   I discussed the limitations, risks, security and privacy concerns of performing an evaluation and management service by telephone and the availability of in person appointments. I also discussed with the patient that there may be a patient responsible charge related to this service. The patient expressed understanding and agreed to proceed.   History of Present Illness: 69 year old female.  Past medical history significant for OSA on CPAP, insomnia.  Patient of Dr. Wynona Neat, last seen Oct 15, 2019.  No significant issues with CPAP.  Ambien seems to be helping for insomnia.  Patient to follow-up in 3 months.  12/08/2019 Patient contacted today by telephone for 43-month follow-up visit asthma and OSA/insomnia.  She is doing well, reports that every thing is fine. She has asthma symptoms 1 months d/t allergies. She received course of prednisone from her PCP with improvement. She is compliant with BREO 200. She uses her nebulizer 2-3 times a day depending on weather/pollen. She is taking Singulair 10mg  at bedtime.   Reports compliance with CPAP use. She is using her sisters machine, she thinks it is about 45 years old. She has SD card which she will need to bring to office for compliance check.  She is currently in [place] because her son had covid.   No issues with mask fit or pressure setting. Takes Ambien every night at 10pm, it takes her about an hour to fall asleep. She gets 6-7 hours of sleep. She wakes up to use the restroom.     Observations/Objective:  - No overt shortness of breath, wheezing or cough - Some difficulty understanding her at time   Assessment and Plan:  OSA on CPAP: - Diagnosed in 2019/ no sleep study on file with [place]  - Reports  compliance with CPAP, she is using her sisters machine  - No issues with mask fit or pressure setting - FU in 3 months, needs to bring in SD card for compliance check and to establish with DME company   Insomnia: - Continue Ambien 12.5mg  at bedtime for insomnia (RX sent to [facility] in [place], [place] without refill) - PMP reviewed, no unexpected prescriptions found. Reviewed safe use.  Asthma: - Maintained on Breo 200; Singulair and prn albuterol nebulizer  - She had an asthma exacerbation last month treated with oral prednisone prescribed by PCP   Follow Up Instructions:   - 3 months with Dr. [place]   I discussed the assessment and treatment plan with the patient. The patient was provided an opportunity to ask questions and all were answered. The patient agreed with the plan and demonstrated an understanding of the instructions.   The patient was advised to call back or seek an in-person evaluation if the symptoms worsen or if the condition fails to improve as anticipated.  I provided 22 minutes of non-face-to-face time during this encounter.   Wynona Neat, NP

## 2019-12-08 NOTE — Assessment & Plan Note (Signed)
-   Reports compliance with CPAP, she is using her sisters machine and needs to bring in SD card for compliance check

## 2019-12-08 NOTE — Assessment & Plan Note (Signed)
-   Continue Ambien 12.5mg  at bedtime for insomnia (RX sent to [facility] in [place], [place] without refill) - PMP reviewed, no unexpected prescriptions found. Reviewed safe use.

## 2019-12-08 NOTE — Patient Instructions (Signed)
Obstructive sleep apnea  Continue to wear CPAP for 4 to 6 hours or more each night Do not drive if experiencing excessive daytime fatigue or somnolence Continue to work on weight loss efforts  Insomnia Continue Ambien 12.5 mg at bedtime as needed-do not combine with other sedating medications, alcohol and do not drive while taking  Asthma Continue Breo 200 one puff daily (rinse mouth with use) Continue albuterol nebulizer every 6 hours as needed for breakthrough shortness of breath or wheezing If you have more than 2-3 exacerbations requiring prednisone in a year please notify office may need additional work-up or testing  Follow-up 3 months with Dr. Wynona Neat -please bring in SD card for compliance check

## 2019-12-13 ENCOUNTER — Telehealth: Payer: Self-pay | Admitting: Pulmonary Disease

## 2019-12-13 MED ORDER — BREO ELLIPTA 200-25 MCG/INH IN AEPB: 1.0000 | INHALATION_SPRAY | Freq: Every day | RESPIRATORY_TRACT | 3 refills | Status: AC

## 2019-12-13 MED ORDER — MONTELUKAST SODIUM 10 MG PO TABS: 10.0000 mg | ORAL_TABLET | Freq: Every day | ORAL | 5 refills | Status: DC

## 2019-12-13 MED ORDER — ALBUTEROL SULFATE 0.63 MG/3ML IN NEBU: 1.0000 | INHALATION_SOLUTION | Freq: Four times a day (QID) | RESPIRATORY_TRACT | 1 refills | Status: AC | PRN

## 2019-12-13 NOTE — Telephone Encounter (Signed)
Dr. Val Eagle, please advise if you are okay refilling pt's Ambien sending [facility] to [facility] in [place] for pt.

## 2019-12-13 NOTE — Telephone Encounter (Signed)
Called and left VM for patient letting her know that refills for her Breo, singular and nubulizer were [facility] [place] as requested by patient.  Advised that I sent a message to Dr. Wynona Neat that she needed a refill of her Ambien.

## 2019-12-13 NOTE — Telephone Encounter (Signed)
Dr. Wynona Neat, Patient states she is completely out of her Jennifer Clay and needs a refill.  Thank you.

## 2019-12-14 ENCOUNTER — Other Ambulatory Visit: Payer: Self-pay | Admitting: Pulmonary Disease

## 2019-12-14 MED ORDER — ZOLPIDEM TARTRATE ER 12.5 MG PO TBCR: 12.5000 mg | EXTENDED_RELEASE_TABLET | Freq: Every evening | ORAL | 1 refills | Status: DC | PRN

## 2019-12-14 NOTE — Telephone Encounter (Signed)
Pt returning a phone call. Pt can be reached at [phone]

## 2019-12-14 NOTE — Telephone Encounter (Signed)
Called and spoke with pt letting her know that [facility] refilled her [place]. Pt verbalized  Understanding. Nothing further needed.

## 2019-12-14 NOTE — Progress Notes (Signed)
[facility]

## 2019-12-14 NOTE — Telephone Encounter (Signed)
[facility] 

## 2019-12-14 NOTE — Telephone Encounter (Signed)
Lm for pt

## 2019-12-17 NOTE — Telephone Encounter (Signed)
Left VM for patient letting her know that all of the prescriptions she requested have been sent to the pharmacy that she requested.  Advised to call for any further questions.

## 2020-01-10 ENCOUNTER — Telehealth: Payer: Self-pay | Admitting: Pulmonary Disease

## 2020-01-11 NOTE — Telephone Encounter (Signed)
ATC pt, call went straight to VM but the mailbox was full. Will try back.

## 2020-01-13 NOTE — Telephone Encounter (Signed)
Ambien and Singulair are not ready for new scripts per pts chart. Called and spoke to pt. She states she got her refills from her pharmacy yesterday and denies needing anything further. Will sign off.

## 2020-02-09 ENCOUNTER — Other Ambulatory Visit: Payer: Self-pay | Admitting: Pulmonary Disease

## 2020-02-09 ENCOUNTER — Telehealth: Payer: Self-pay | Admitting: Pulmonary Disease

## 2020-02-09 MED ORDER — ZOLPIDEM TARTRATE ER 12.5 MG PO TBCR: 12.5000 mg | EXTENDED_RELEASE_TABLET | Freq: Every evening | ORAL | 1 refills | Status: DC | PRN

## 2020-02-09 NOTE — Telephone Encounter (Signed)
Refilled ambien

## 2020-02-09 NOTE — Telephone Encounter (Signed)
Spoke with the pt and notified will send refill request to Dr Wynona Neat for Remus Loffler 12.5  Please advise if okay to refill thanks

## 2020-02-10 NOTE — Telephone Encounter (Signed)
ATC pt. VM box is full. WCB.  

## 2020-02-12 NOTE — Telephone Encounter (Signed)
Attempted to call pt but unable to reach and unable to leave VM due to mailbox being full. Will try to call back later. 

## 2020-02-14 ENCOUNTER — Other Ambulatory Visit: Payer: Self-pay | Admitting: Pulmonary Disease

## 2020-02-14 MED ORDER — ZOLPIDEM TARTRATE ER 12.5 MG PO TBCR: 12.5000 mg | EXTENDED_RELEASE_TABLET | Freq: Every evening | ORAL | 1 refills | Status: DC | PRN

## 2020-02-14 NOTE — Telephone Encounter (Signed)
Think I took care of it

## 2020-02-14 NOTE — Telephone Encounter (Signed)
Called to let patient know that her RX was resent to the pharmacy in [place]. She expressed understanding. Nothing further needed at this time.

## 2020-02-14 NOTE — Telephone Encounter (Signed)
Pt needs ambein sent to correct pharm in [place], [place]-  [address] [phone].

## 2020-02-14 NOTE — Telephone Encounter (Signed)
Dr. Wynona Neat please resend in this prescription patient needs it sent to the pharmacy in [place], [place] looks like it was sent to one in [facility]. I have pended order below.

## 2020-02-23 ENCOUNTER — Telehealth: Payer: Self-pay

## 2020-02-23 ENCOUNTER — Other Ambulatory Visit: Payer: Self-pay | Admitting: Pulmonary Disease

## 2020-02-23 MED ORDER — ZOLPIDEM TARTRATE ER 12.5 MG PO TBCR: 12.5000 mg | EXTENDED_RELEASE_TABLET | Freq: Every evening | ORAL | 1 refills | Status: AC | PRN

## 2020-02-23 NOTE — Telephone Encounter (Signed)
Zolpidem prescription sent in

## 2020-03-14 ENCOUNTER — Telehealth: Payer: Self-pay | Admitting: Pulmonary Disease

## 2020-03-15 NOTE — Telephone Encounter (Signed)
LMTCB for pt  She is due for appt and needs to be aware had to get MD's ok to refill [place]

## 2020-03-16 NOTE — Telephone Encounter (Signed)
lmtcb for pt x 2.   Per message pt left with the front staff, she is requesting an Ambien refill.  Last refilled on 02/23/2020 with #30 with 1 refill. Pt last OV on 12/08/2019 by Buelah Manis, NP, as a televisit and advised to f/u in 3 months.  Pt does not have a pending appt.    Dr. Wynona Neat please advise. Thanks.

## 2020-03-16 NOTE — Telephone Encounter (Signed)
If medication was last refilled 02/23/2020, this means she should still have about 5 weeks of the medication left Refill will not be needed until 1 to 2 weeks prior to 04/24/2020

## 2020-03-16 NOTE — Telephone Encounter (Signed)
Attempted to call pt but line went straight to VM. Left message for her to return call. 

## 2020-03-21 NOTE — Telephone Encounter (Signed)
LMTCB for pt.  Will sign off on message as there have been several unsuccessful attempts to reach pt. Will close encounter per triage protocol.

## 2020-03-28 NOTE — Telephone Encounter (Signed)
Spoke with patient to let her know that she should not need refill of her Ambien until around 04/24/20 since it was last filled on 02/23/20. She expressed understanding. Nothing further needed at this time.

## 2020-04-16 ENCOUNTER — Other Ambulatory Visit: Payer: Self-pay | Admitting: Pulmonary Disease

## 2020-06-19 ENCOUNTER — Other Ambulatory Visit: Payer: Self-pay | Admitting: Pulmonary Disease

## 2021-06-03 IMAGING — DX DG CHEST 1V PORT
1 series · 1 of 1 positions shown · non-contrast
Comparison: None.

CLINICAL DATA: Abdominal pain. Nausea and vomiting. Shortness of
breath.

EXAM:
PORTABLE CHEST 1 VIEW

[chest ap]
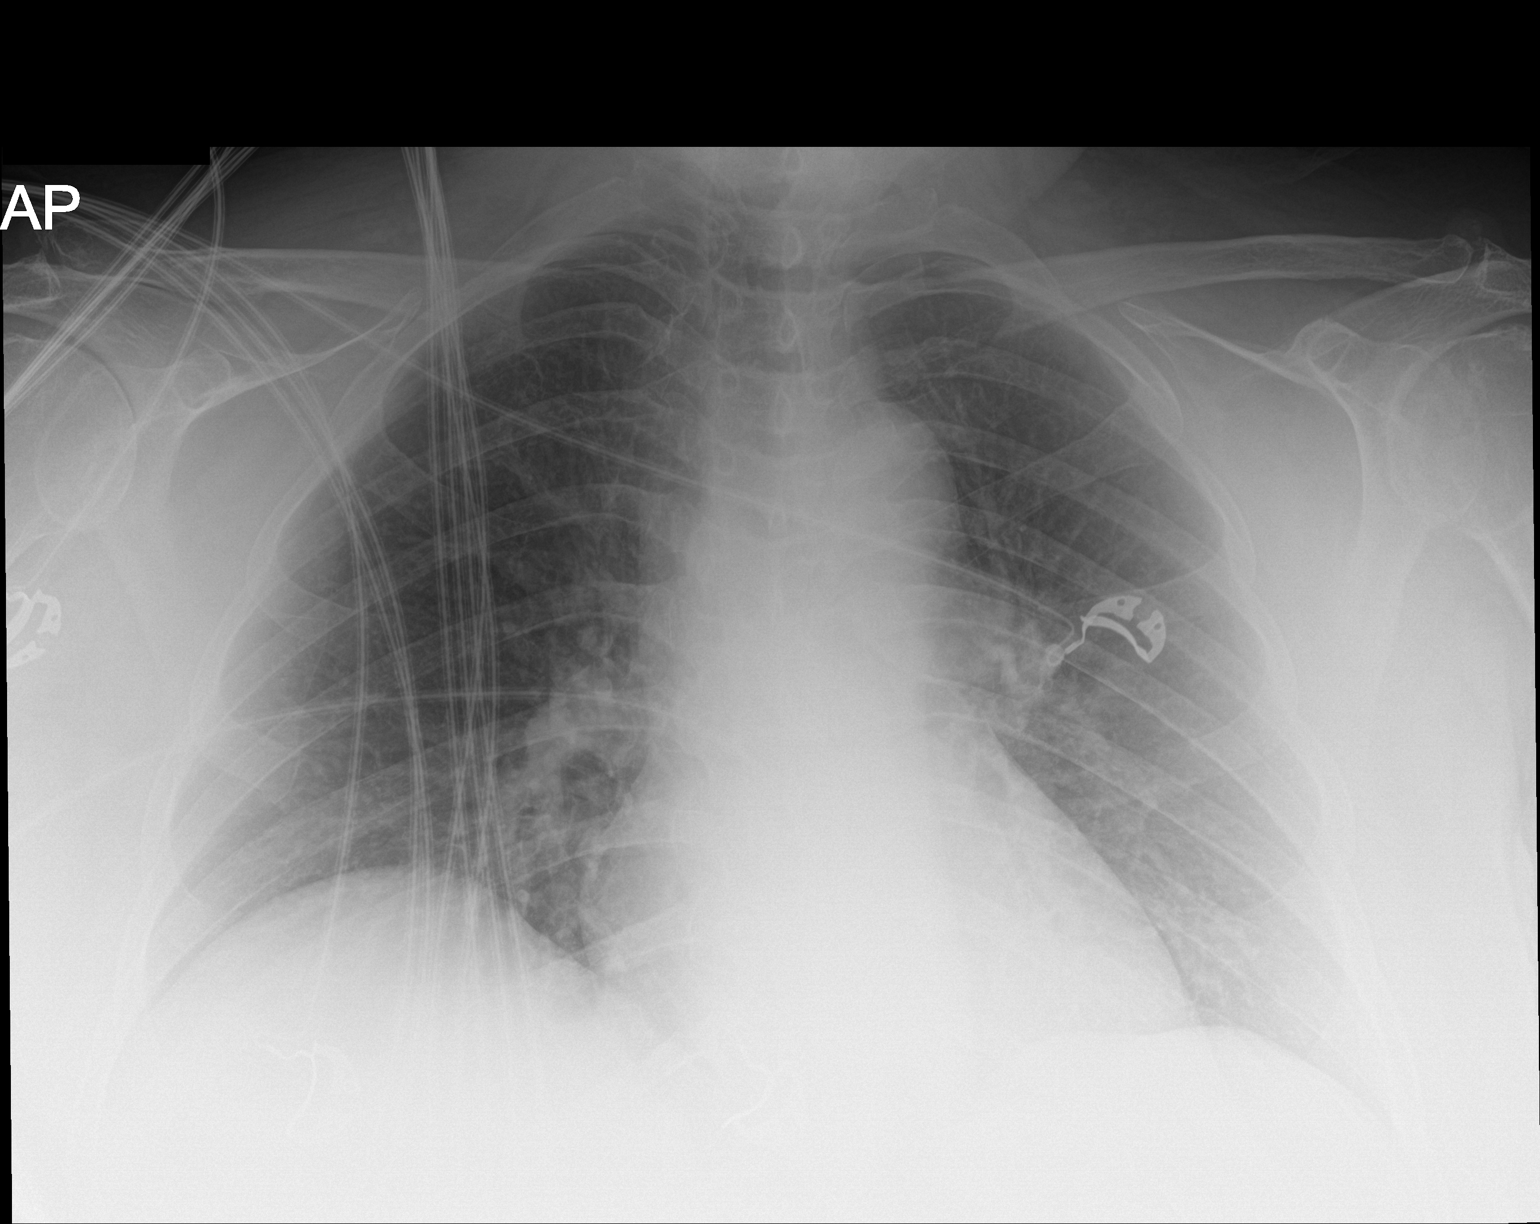

[1 of 1 positions shown; findings below may reference images not displayed]

FINDINGS: The heart size and mediastinal contours are within normal limits.
Both lungs are clear. The visualized skeletal structures are
unremarkable.
IMPRESSION: No active disease.
